# Patient Record
Sex: Female | Born: 1980 | Race: Black or African American | Hispanic: No | Marital: Single | Smoking: Never smoker
Health system: Southern US, Community
[De-identification: ages and names within clinical notes are randomized; demographics above are authoritative.]

## PROBLEM LIST (undated history)

## (undated) ENCOUNTER — Emergency Department (HOSPITAL_BASED_OUTPATIENT_CLINIC_OR_DEPARTMENT_OTHER): Payer: Medicaid Other | Source: Home / Self Care

## (undated) DIAGNOSIS — E611 Iron deficiency: Secondary | ICD-10-CM

## (undated) HISTORY — PX: BUNIONECTOMY: SHX129

---

## 2010-05-10 ENCOUNTER — Ambulatory Visit: Payer: Self-pay | Admitting: Diagnostic Radiology

## 2010-05-10 ENCOUNTER — Emergency Department (HOSPITAL_BASED_OUTPATIENT_CLINIC_OR_DEPARTMENT_OTHER): Admission: EM | Admit: 2010-05-10 | Discharge: 2010-05-10 | Payer: Self-pay | Admitting: Emergency Medicine

## 2010-06-10 ENCOUNTER — Encounter
Admission: RE | Admit: 2010-06-10 | Discharge: 2010-07-11 | Payer: Self-pay | Source: Home / Self Care | Admitting: Orthopedic Surgery

## 2010-06-30 ENCOUNTER — Emergency Department (HOSPITAL_BASED_OUTPATIENT_CLINIC_OR_DEPARTMENT_OTHER): Admission: EM | Admit: 2010-06-30 | Discharge: 2010-06-30 | Payer: Self-pay | Admitting: Emergency Medicine

## 2010-06-30 ENCOUNTER — Ambulatory Visit: Payer: Self-pay | Admitting: Radiology

## 2010-11-08 ENCOUNTER — Emergency Department (HOSPITAL_BASED_OUTPATIENT_CLINIC_OR_DEPARTMENT_OTHER)
Admission: EM | Admit: 2010-11-08 | Discharge: 2010-11-08 | Disposition: A | Discharge status: Home / Self Care | Payer: Medicaid Other | Attending: Emergency Medicine | Admitting: Emergency Medicine

## 2010-11-08 DIAGNOSIS — F410 Panic disorder [episodic paroxysmal anxiety] without agoraphobia: Secondary | ICD-10-CM | POA: Insufficient documentation

## 2010-11-08 DIAGNOSIS — R0602 Shortness of breath: Secondary | ICD-10-CM | POA: Insufficient documentation

## 2010-12-08 LAB — PREGNANCY, URINE: Preg Test, Ur: NEGATIVE

## 2011-03-01 ENCOUNTER — Emergency Department (HOSPITAL_BASED_OUTPATIENT_CLINIC_OR_DEPARTMENT_OTHER)
Admission: EM | Admit: 2011-03-01 | Discharge: 2011-03-01 | Disposition: A | Discharge status: Home / Self Care | Payer: Medicaid Other | Attending: Emergency Medicine | Admitting: Emergency Medicine

## 2011-03-01 DIAGNOSIS — B081 Molluscum contagiosum: Secondary | ICD-10-CM | POA: Insufficient documentation

## 2011-09-02 ENCOUNTER — Emergency Department (HOSPITAL_BASED_OUTPATIENT_CLINIC_OR_DEPARTMENT_OTHER)
Admission: EM | Admit: 2011-09-02 | Discharge: 2011-09-02 | Disposition: A | Discharge status: Home / Self Care | Payer: Self-pay | Attending: Emergency Medicine | Admitting: Emergency Medicine

## 2011-09-02 ENCOUNTER — Encounter: Payer: Self-pay | Admitting: *Deleted

## 2011-09-02 DIAGNOSIS — R3915 Urgency of urination: Secondary | ICD-10-CM | POA: Insufficient documentation

## 2011-09-02 LAB — URINALYSIS, ROUTINE W REFLEX MICROSCOPIC
Bilirubin Urine: NEGATIVE
Glucose, UA: NEGATIVE mg/dL
Hgb urine dipstick: NEGATIVE
Ketones, ur: NEGATIVE mg/dL
Protein, ur: NEGATIVE mg/dL
Specific Gravity, Urine: 1.009 (ref 1.005–1.030)
pH: 7.5 (ref 5.0–8.0)

## 2011-09-02 LAB — URINE MICROSCOPIC-ADD ON

## 2011-09-02 NOTE — ED Provider Notes (Signed)
History  This chart was scribed for Forbes Cellar, MD by Bennett Scrape. This patient was seen in room MHCT3/MHCT3 and the patient's care was started at 5:52PM.  CSN: [id number] Arrival date & time: 09/02/2011  5:00 PM   First MD Initiated Contact with Patient 09/02/11 1740      Chief Complaint  Patient presents with  . Urinary Tract Infection    The history is provided by the patient. No language interpreter was used.   Laura Liu is a 30 y.o. female who presents to the Emergency Department complaining of one day of gradual onset, non-changing increased frequency to urinate that she believes is a UTI. Pt states that she feels the urge to urinate 3 to 4 times during the day.  Pt states that she is experiencing but milder symptoms experienced with a UTI. Pt denies any modifying factors. Pt denies taking anything to improve symptoms. Pt denies dysuria, hematuria, chills, fever, nausea, vomiting, back pain, and neck pain as associated symptoms. Pt denies a h/o diabetes and the possibility of being pregnant. She denies increase thirst. No h/o DM  History reviewed. No pertinent past medical history.  History reviewed. No pertinent past surgical history.  No family history on file.  History  Substance Use Topics  . Smoking status: Never Smoker   . Smokeless tobacco: Not on file  . Alcohol Use: No    OB History    Grav Para Term Preterm Abortions TAB SAB Ect Mult Living                  Review of Systems A complete 10 system review of systems was obtained and is otherwise negative except as noted in the HPI.   Allergies  Review of patient's allergies indicates no known allergies.  Home Medications   Current Outpatient Rx  Name Route Sig Dispense Refill  . MEDROXYPROGESTERONE ACETATE 150 MG/ML IM SUSP Intramuscular Inject 150 mg into the muscle every 3 (three) months.        Triage Vitals: BP 113/76  Pulse 82  Temp(Src) 97.8 F (36.6 C) (Oral)  Resp 20  SpO2  100%  Physical Exam  Nursing note and vitals reviewed. Constitutional: She is oriented to person, place, and time. She appears well-developed and well-nourished.  HENT:  Head: Normocephalic and atraumatic.  Eyes: EOM are normal. Pupils are equal, round, and reactive to light.  Neck: Neck supple. No tracheal deviation present.  Cardiovascular: Normal rate, regular rhythm and normal heart sounds.  Exam reveals no gallop and no friction rub.   No murmur heard. Pulmonary/Chest: Effort normal and breath sounds normal.       Lungs are clear to auscultation bilaterally.  Abdominal: Soft. There is no tenderness. There is no rebound and no guarding.  Musculoskeletal: Normal range of motion. She exhibits no edema.  Neurological: She is alert and oriented to person, place, and time.  Skin: Skin is warm and dry.    ED Course  Procedures (including critical care time)  DIAGNOSTIC STUDIES: Oxygen Saturation is 100% on room air, normal by my interpretation.   COORDINATION OF CARE: 5:53PM-Discussed negative urine tests and pt acknowledged findings. Pt turned down more extensive urine testing. Pt requested that she be discharged from the ED before 6:00PM.   Labs Reviewed  URINALYSIS, ROUTINE W REFLEX MICROSCOPIC - Abnormal; Notable for the following:    Leukocytes, UA TRACE (*)    All other components within normal limits  URINE MICROSCOPIC-ADD ON - Abnormal; Notable for the  following:    Squamous Epithelial / LPF FEW (*)    Bacteria, UA FEW (*)    All other components within normal limits  PREGNANCY, URINE  URINE CULTURE   No results found.   1. Urinary urgency     MDM  Urinary urgency. U/A with few bacteria but also squam cells, no significant amt of WBC. Will send for cx and treat accordingly. No abdominal pain. Declining glu check today. Precautions for return.  I personally performed the services described in this documentation, which was scribed in my presence. The recorded  information has been reviewed and considered.    Forbes Cellar, MD 09/03/11 0001

## 2011-09-02 NOTE — ED Notes (Signed)
Pt d/c home- no Rx given

## 2011-09-02 NOTE — ED Notes (Signed)
Pt presnts to ED today with UTI sx since yesterday.

## 2011-09-03 LAB — URINE CULTURE: Culture  Setup Time: 201212082326

## 2012-02-01 ENCOUNTER — Encounter (HOSPITAL_BASED_OUTPATIENT_CLINIC_OR_DEPARTMENT_OTHER): Payer: Self-pay | Admitting: *Deleted

## 2012-02-01 ENCOUNTER — Emergency Department (INDEPENDENT_AMBULATORY_CARE_PROVIDER_SITE_OTHER): Payer: Medicaid Other

## 2012-02-01 ENCOUNTER — Emergency Department (HOSPITAL_BASED_OUTPATIENT_CLINIC_OR_DEPARTMENT_OTHER)
Admission: EM | Admit: 2012-02-01 | Discharge: 2012-02-01 | Disposition: A | Discharge status: Home / Self Care | Payer: Medicaid Other | Attending: Emergency Medicine | Admitting: Emergency Medicine

## 2012-02-01 DIAGNOSIS — M79609 Pain in unspecified limb: Secondary | ICD-10-CM

## 2012-02-01 DIAGNOSIS — M25539 Pain in unspecified wrist: Secondary | ICD-10-CM

## 2012-02-01 DIAGNOSIS — W19XXXA Unspecified fall, initial encounter: Secondary | ICD-10-CM | POA: Insufficient documentation

## 2012-02-01 DIAGNOSIS — S6990XA Unspecified injury of unspecified wrist, hand and finger(s), initial encounter: Secondary | ICD-10-CM | POA: Insufficient documentation

## 2012-02-01 DIAGNOSIS — M79643 Pain in unspecified hand: Secondary | ICD-10-CM

## 2012-02-01 MED ORDER — IBUPROFEN 400 MG PO TABS: 400.0000 mg | ORAL_TABLET | Freq: Four times a day (QID) | ORAL | Status: AC | PRN

## 2012-02-01 NOTE — Discharge Instructions (Signed)
Wrist Pain Wrist injuries are frequent in adults and children. A sprain is an injury to the ligaments that hold your bones together. A strain is an injury to muscle or muscle cord-like structures (tendons) from stretching or pulling. Generally, when wrists are moderately tender to touch following a fall or injury, a break in the bone (fracture) may be present. Most wrist sprains or strains are better in 3 to 5 days, but complete healing may take several weeks. HOME CARE INSTRUCTIONS   Put ice on the injured area.   Put ice in a plastic bag.   Place a towel between your skin and the bag.   Leave the ice on for 15 to 20 minutes, 3 to 4 times a day, for the first 2 days.   Keep your arm raised above the level of your heart whenever possible to reduce swelling and pain.   Rest the injured area for at least 48 hours or as directed by your caregiver.   If a splint or elastic bandage has been applied, use it for as long as directed by your caregiver or until seen by a caregiver for a follow-up exam.   Only take over-the-counter or prescription medicines for pain, discomfort, or fever as directed by your caregiver.   Keep all follow-up appointments. You may need to follow up with a specialist or have follow-up X-rays. Improvement in pain level is not a guarantee that you did not fracture a bone in your wrist. The only way to determine whether or not you have a broken bone is by X-ray.  SEEK IMMEDIATE MEDICAL CARE IF:   Your fingers are swollen, very red, white, or cold and blue.   Your fingers are numb or tingling.   You have increasing pain.   You have difficulty moving your fingers.  MAKE SURE YOU:   Understand these instructions.   Will watch your condition.   Will get help right away if you are not doing well or get worse.  Document Released: 06/21/2005 Document Revised: 08/31/2011 Document Reviewed: 11/02/2010 [employer] Patient Information 2012 [employer]. 

## 2012-02-01 NOTE — ED Provider Notes (Signed)
Medical screening examination/treatment/procedure(s) were performed by non-physician practitioner and as supervising physician I was immediately available for consultation/collaboration.  Kloey Cazarez T Chaise Passarella, MD 02/01/12 2211 

## 2012-02-01 NOTE — ED Notes (Signed)
Fell 3 weeks ago. Injury to her right hand.

## 2012-02-01 NOTE — ED Notes (Signed)
After assessment the Pt. Now reports the R wrist hurts more than her R hand

## 2012-02-01 NOTE — ED Provider Notes (Signed)
History     CSN: [id number]  Arrival date & time 02/01/12  1908   First MD Initiated Contact with Patient 02/01/12 1923      No chief complaint on file.   (Consider location/radiation/quality/duration/timing/severity/associated sxs/prior treatment) HPI Comments: Pt states that she fell 3 weeks ago and she he has been having pain to right wrist and hand:pt states that she was seen at [facility] and was given something for pain:pt states that she is continuing to have pain:pt states that she has pain with rotation of wrist  Patient is a 31 y.o. female presenting with hand injury. The history is provided by the patient. No language interpreter was used.  Hand Injury  The incident occurred more than 1 week ago. The injury mechanism was a fall. The pain is present in the right wrist and right hand. The quality of the pain is described as aching. The pain is moderate. The pain has been constant since the incident. She reports no foreign bodies present. The symptoms are aggravated by movement. The treatment provided no relief.    History reviewed. No pertinent past medical history.  History reviewed. No pertinent past surgical history.  No family history on file.  History  Substance Use Topics  . Smoking status: Never Smoker   . Smokeless tobacco: Not on file  . Alcohol Use: No    OB History    Grav Para Term Preterm Abortions TAB SAB Ect Mult Living                  Review of Systems  Constitutional: Negative.   Respiratory: Negative.   Cardiovascular: Negative.   Neurological: Negative.     Allergies  Review of patient's allergies indicates no known allergies.  Home Medications   Current Outpatient Rx  Name Route Sig Dispense Refill  . MEDROXYPROGESTERONE ACETATE 150 MG/ML IM SUSP Intramuscular Inject 150 mg into the muscle every 3 (three) months.        BP 122/77  Pulse 83  Temp(Src) 98.6 F (37 C) (Oral)  Resp 16  Ht 5\' 6"  (1.676 m)  Wt 109 lb (49.442 kg)   BMI 17.59 kg/m2  SpO2 100%  Physical Exam  Nursing note and vitals reviewed. Constitutional: She is oriented to person, place, and time. She appears well-developed and well-nourished.  Cardiovascular: Normal rate and regular rhythm.   Pulmonary/Chest: Effort normal and breath sounds normal.  Musculoskeletal: Normal range of motion.       No swelling or deformity noted to the wrist or hand:pt has generalized tenderness noted to the wrist:pulses intact  Neurological: She is alert and oriented to person, place, and time.  Skin: Skin is warm and dry.    ED Course  Procedures (including critical care time)  Labs Reviewed - No data to display Dg Wrist Complete Right  02/01/2012  *RADIOLOGY REPORT*  Clinical Data: Fall 3 weeks ago.  Wrist pain with radial deviation.  RIGHT WRIST - COMPLETE 3+ VIEW  Comparison: None.  Findings: The wrist is located.  No acute bone or soft tissue abnormality.  IMPRESSION: Negative right wrist.  Original Report Authenticated By: Jamesetta Orleans. MATTERN, M.D.   Dg Hand Complete Right  02/01/2012  *RADIOLOGY REPORT*  Clinical Data: Fall.  Hand and wrist pain.  RIGHT HAND - COMPLETE 3+ VIEW  Comparison: None.  Findings: Anatomic alignment of the right hand.  No fracture.  Soft tissues appear within normal limits.  IMPRESSION: Negative radiographs of the right hand.  Original  Report Authenticated By: Andreas Newport, M.D.     1. Wrist pain   2. Hand pain       MDM  Pt is neurologically intact:no bony deformity noted:pt splinted for comfort:pt given ortho follow up        Teressa Lower, NP 02/01/12 2014

## 2013-02-08 ENCOUNTER — Emergency Department (HOSPITAL_BASED_OUTPATIENT_CLINIC_OR_DEPARTMENT_OTHER): Payer: Medicaid Other

## 2013-02-08 ENCOUNTER — Emergency Department (HOSPITAL_BASED_OUTPATIENT_CLINIC_OR_DEPARTMENT_OTHER)
Admission: EM | Admit: 2013-02-08 | Discharge: 2013-02-08 | Disposition: A | Discharge status: Home / Self Care | Payer: Medicaid Other | Attending: Emergency Medicine | Admitting: Emergency Medicine

## 2013-02-08 ENCOUNTER — Encounter (HOSPITAL_BASED_OUTPATIENT_CLINIC_OR_DEPARTMENT_OTHER): Payer: Self-pay | Admitting: *Deleted

## 2013-02-08 DIAGNOSIS — J3489 Other specified disorders of nose and nasal sinuses: Secondary | ICD-10-CM | POA: Insufficient documentation

## 2013-02-08 DIAGNOSIS — H00019 Hordeolum externum unspecified eye, unspecified eyelid: Secondary | ICD-10-CM | POA: Insufficient documentation

## 2013-02-08 DIAGNOSIS — R059 Cough, unspecified: Secondary | ICD-10-CM | POA: Insufficient documentation

## 2013-02-08 DIAGNOSIS — R079 Chest pain, unspecified: Secondary | ICD-10-CM | POA: Insufficient documentation

## 2013-02-08 DIAGNOSIS — R0982 Postnasal drip: Secondary | ICD-10-CM | POA: Insufficient documentation

## 2013-02-08 DIAGNOSIS — R05 Cough: Secondary | ICD-10-CM | POA: Insufficient documentation

## 2013-02-08 DIAGNOSIS — H00013 Hordeolum externum right eye, unspecified eyelid: Secondary | ICD-10-CM

## 2013-02-08 DIAGNOSIS — Z3202 Encounter for pregnancy test, result negative: Secondary | ICD-10-CM | POA: Insufficient documentation

## 2013-02-08 LAB — PREGNANCY, URINE: Preg Test, Ur: NEGATIVE

## 2013-02-08 MED ORDER — LORATADINE-PSEUDOEPHEDRINE ER 5-120 MG PO TB12: 1.0000 | ORAL_TABLET | Freq: Two times a day (BID) | ORAL | Status: DC

## 2013-02-08 MED ORDER — BACITRACIN 500 UNIT/GM EX OINT
1.0000 "application " | TOPICAL_OINTMENT | Freq: Two times a day (BID) | CUTANEOUS | Status: DC
  Administered 2013-02-08: 1 via TOPICAL
  Filled 2013-02-08: qty 0.9

## 2013-02-08 NOTE — ED Provider Notes (Signed)
History    This chart was scribed for Charles B. Bernette Mayers, MD by Quintella Reichert, ED scribe.  This patient was seen in room MH05/MH05 and the patient's care was started at 9:19 PM.   CSN: [id number]  Arrival date & time 02/08/13  2013      Chief Complaint  Patient presents with  . Cough     The history is provided by the patient. No language interpreter was used.    HPI Comments: Laura Liu is a 32 y.o. female who presents to the Emergency Department complaining of cough that she has had "all winter long."  Pt states cough is worst at night and is productive of phlegm that is sometimes clear and sometimes green.  She also reports intermittent CP secondary to cough.  She notes that she attempted to treat cough with Mucinex, which relieved cough only temporarily.  Pt also reports rhinorrhea, post-nasal drip, and congestion.  She denies fever, chills, emesis, diarrhea, appetite change, swelling, CP, SOB, acid reflux, or any other associated symptoms..  She has not attempted to treat symptoms with allergy medications.  Pt does not smoke and is not regularly exposed to second-hand smoke.  She states she is not exposed to hazardous chemicals at work.  PCP is Dr. Ferdinand Cava at [facility]   History reviewed. No pertinent past medical history.  History reviewed. No pertinent past surgical history.  No family history on file.  History  Substance Use Topics  . Smoking status: Never Smoker   . Smokeless tobacco: Not on file  . Alcohol Use: No    OB History   Grav Para Term Preterm Abortions TAB SAB Ect Mult Living                  Review of Systems A complete 10 system review of systems was obtained and all systems are negative except as noted in the HPI and PMH.    Allergies  Review of patient's allergies indicates no known allergies.  Home Medications   Current Outpatient Rx  Name  Route  Sig  Dispense  Refill  . medroxyPROGESTERone (DEPO-PROVERA) 150 MG/ML injection    Intramuscular   Inject 150 mg into the muscle every 3 (three) months.             BP 118/87  Pulse 78  Temp(Src) 98.9 F (37.2 C) (Oral)  Resp 20  Ht 5\' 6"  (1.676 m)  Wt 109 lb (49.442 kg)  BMI 17.6 kg/m2  SpO2 96%  Physical Exam  Nursing note and vitals reviewed. Constitutional: She is oriented to person, place, and time. She appears well-developed and well-nourished.  HENT:  Head: Normocephalic and atraumatic.  Eyes: EOM are normal. Pupils are equal, round, and reactive to light.  Sty in left eyelid  Neck: Normal range of motion. Neck supple.  Cardiovascular: Normal rate, regular rhythm, normal heart sounds and intact distal pulses.   No murmur heard. Pulmonary/Chest: Effort normal and breath sounds normal. No respiratory distress. She has no wheezes. She has no rales.  Abdominal: Bowel sounds are normal. She exhibits no distension. There is no tenderness.  Musculoskeletal: Normal range of motion. She exhibits no edema and no tenderness.  Neurological: She is alert and oriented to person, place, and time. She has normal strength. No cranial nerve deficit or sensory deficit.  Skin: Skin is warm and dry. No rash noted.  Psychiatric: She has a normal mood and affect.    ED Course  Procedures (including critical care  time)  DIAGNOSTIC STUDIES: Oxygen Saturation is 96% on room air, normal by my interpretation.    COORDINATION OF CARE: 9:23 PM-Discussed treatment plan which includes imaging with pt at bedside and pt agreed to plan.      Labs Reviewed  PREGNANCY, URINE   Dg Chest 2 View  02/08/2013   *RADIOLOGY REPORT*  Clinical Data: Cough for 6 months  CHEST - 2 VIEW  Comparison: 06/30/2010  Findings: The heart size and mediastinal contours are within normal limits. The lungs are hyperinflated and clear.  The visualized skeletal structures are unremarkable.  IMPRESSION: 1.  Hyperinflation.   Original Report Authenticated By: Signa Kell, M.D.     1. Cough   2.  Post-nasal drip   3. Hordeolum, right       MDM  CXR is negative. No wheezing or hypoxia. Suspect cough is due to post-nasal drip. Advised decongestant/antihistamine. PCP follow up if symptoms persistent. Pt and mother asking about inhaler, but doubt that this would be helpful given normal exam. Bacitracin and warm compresses for hordeolum.      I personally performed the services described in this documentation, which was scribed in my presence. The recorded information has been reviewed and is accurate.     Charles B. Bernette Mayers, MD 02/08/13 [phone]

## 2013-02-08 NOTE — ED Notes (Signed)
Pt states she has had a cough"all winter long and now it has come back". Prod with clr sputum at times and green at others. BBS-clr

## 2013-08-08 ENCOUNTER — Encounter (HOSPITAL_BASED_OUTPATIENT_CLINIC_OR_DEPARTMENT_OTHER): Payer: Self-pay | Admitting: Emergency Medicine

## 2013-08-08 ENCOUNTER — Emergency Department (HOSPITAL_BASED_OUTPATIENT_CLINIC_OR_DEPARTMENT_OTHER)
Admission: EM | Admit: 2013-08-08 | Discharge: 2013-08-08 | Disposition: A | Discharge status: Home / Self Care | Payer: Medicaid Other | Attending: Emergency Medicine | Admitting: Emergency Medicine

## 2013-08-08 DIAGNOSIS — Z79899 Other long term (current) drug therapy: Secondary | ICD-10-CM | POA: Insufficient documentation

## 2013-08-08 DIAGNOSIS — J069 Acute upper respiratory infection, unspecified: Secondary | ICD-10-CM | POA: Insufficient documentation

## 2013-08-08 DIAGNOSIS — R110 Nausea: Secondary | ICD-10-CM | POA: Insufficient documentation

## 2013-08-08 DIAGNOSIS — R42 Dizziness and giddiness: Secondary | ICD-10-CM | POA: Insufficient documentation

## 2013-08-08 DIAGNOSIS — Z3202 Encounter for pregnancy test, result negative: Secondary | ICD-10-CM | POA: Insufficient documentation

## 2013-08-08 LAB — URINALYSIS, ROUTINE W REFLEX MICROSCOPIC
Bilirubin Urine: NEGATIVE
Hgb urine dipstick: NEGATIVE
Ketones, ur: NEGATIVE mg/dL
Nitrite: NEGATIVE
Protein, ur: NEGATIVE mg/dL
Specific Gravity, Urine: 1.002 — ABNORMAL LOW (ref 1.005–1.030)
Urobilinogen, UA: 0.2 mg/dL (ref 0.0–1.0)
pH: 7 (ref 5.0–8.0)

## 2013-08-08 LAB — URINE MICROSCOPIC-ADD ON

## 2013-08-08 LAB — PREGNANCY, URINE: Preg Test, Ur: NEGATIVE

## 2013-08-08 MED ORDER — MECLIZINE HCL 25 MG PO TABS
12.5000 mg | ORAL_TABLET | Freq: Once | ORAL | Status: AC
  Administered 2013-08-08: 12.5 mg via ORAL
  Filled 2013-08-08: qty 1

## 2013-08-08 MED ORDER — MECLIZINE HCL 50 MG PO TABS: 50.0000 mg | ORAL_TABLET | Freq: Three times a day (TID) | ORAL | Status: DC | PRN

## 2013-08-08 NOTE — ED Notes (Signed)
MD at bedside. 

## 2013-08-08 NOTE — ED Provider Notes (Signed)
CSN: [id number]     Arrival date & time 08/08/13  2213 History  This chart was scribed for Hilario Quarry, MD by Joaquin Music, ED Scribe. This patient was seen in room MH10/MH10 and the patient's care was started at 10:29 PM.    Chief Complaint  Patient presents with  . Dizziness   The history is provided by the patient. No language interpreter was used.   HPI Comments: Reola Buckles is a 32 y.o. female who presents to the Emergency Department complaining of ongoing, worsening dizziness that began 24 hours ago. Pt states she feels the "room is spinning". Pt states she feels nauseas. She states she is able to ambulate but states symtoms worsens when she bends her head down or walks. She states she has had a normal appetite and has been drinking tea frequently. Pt denies having otalgia and nasal congestion.  Pt denies having health complications. Pt denies taking rx everyday. Pt states she is currently on Depo Shots. She is scheduled to F/U 08/12/2013. Pt denies smoking and drinking.   History reviewed. No pertinent past medical history. History reviewed. No pertinent past surgical history. History reviewed. No pertinent family history. History  Substance Use Topics  . Smoking status: Never Smoker   . Smokeless tobacco: Not on file  . Alcohol Use: No   OB History   Grav Para Term Preterm Abortions TAB SAB Ect Mult Living                 Review of Systems  Gastrointestinal: Positive for nausea.  Neurological: Positive for dizziness.  All other systems reviewed and are negative.    Allergies  Review of patient's allergies indicates no known allergies.  Home Medications   Current Outpatient Rx  Name  Route  Sig  Dispense  Refill  . loratadine-pseudoephedrine (CLARITIN-D 12 HOUR) 5-120 MG per tablet   Oral   Take 1 tablet by mouth 2 (two) times daily.   30 tablet   0   . medroxyPROGESTERone (DEPO-PROVERA) 150 MG/ML injection   Intramuscular   Inject 150 mg  into the muscle every 3 (three) months.            BP 150/89  Pulse 86  Temp(Src) 98.3 F (36.8 C) (Oral)  Resp 16  Ht 5\' 5"  (1.651 m)  Wt 95 lb (43.092 kg)  BMI 15.81 kg/m2  SpO2 100%  Physical Exam  Nursing note and vitals reviewed. Constitutional: She is oriented to person, place, and time. She appears well-developed and well-nourished.  HENT:  Head: Normocephalic and atraumatic.  Right Ear: External ear normal.  Left Ear: External ear normal.  Nose: Nose normal.  Mouth/Throat: Oropharynx is clear and moist.  Eyes: Conjunctivae and EOM are normal. Pupils are equal, round, and reactive to light.  No nystagmus noted.  Neck: Normal range of motion. Neck supple.  Cardiovascular: Normal rate, regular rhythm, normal heart sounds and intact distal pulses.   Pulmonary/Chest: Effort normal and breath sounds normal.  Abdominal: Soft. Bowel sounds are normal.  Musculoskeletal: Normal range of motion.  Neurological: She is alert and oriented to person, place, and time. She has normal reflexes.  Skin: Skin is warm and dry.  Psychiatric: She has a normal mood and affect. Her behavior is normal. Thought content normal.    ED Course  Procedures  DIAGNOSTIC STUDIES: Oxygen Saturation is 100% on RA, normal by my interpretation.    COORDINATION OF CARE: 10:31 PM-Discussed treatment plan which includes UA, pregnancy test  and other labs. Pt agreed to plan.   Labs Review Labs Reviewed  URINALYSIS, ROUTINE W REFLEX MICROSCOPIC - Abnormal; Notable for the following:    APPearance CLOUDY (*)    Specific Gravity, Urine 1.002 (*)    Leukocytes, UA SMALL (*)    All other components within normal limits  URINE MICROSCOPIC-ADD ON - Abnormal; Notable for the following:    Squamous Epithelial / LPF MANY (*)    Bacteria, UA FEW (*)    All other components within normal limits  URINE CULTURE  PREGNANCY, URINE   Imaging Review No results found.  EKG Interpretation   None       MDM    1. URI (upper respiratory infection)   2. Vertigo   I personally performed the services described in this documentation, which was scribed in my presence. The recorded information has been reviewed and considered.    Hilario Quarry, MD 08/12/13 1400

## 2013-08-08 NOTE — ED Notes (Signed)
Pt c/o dizziness x 2 days denies any other complaints

## 2013-08-10 LAB — URINE CULTURE

## 2014-04-11 ENCOUNTER — Emergency Department (HOSPITAL_BASED_OUTPATIENT_CLINIC_OR_DEPARTMENT_OTHER): Payer: Medicaid Other

## 2014-04-11 ENCOUNTER — Emergency Department (HOSPITAL_BASED_OUTPATIENT_CLINIC_OR_DEPARTMENT_OTHER)
Admission: EM | Admit: 2014-04-11 | Discharge: 2014-04-11 | Disposition: A | Discharge status: Home / Self Care | Payer: Medicaid Other | Attending: Emergency Medicine | Admitting: Emergency Medicine

## 2014-04-11 ENCOUNTER — Encounter (HOSPITAL_BASED_OUTPATIENT_CLINIC_OR_DEPARTMENT_OTHER): Payer: Self-pay | Admitting: Emergency Medicine

## 2014-04-11 DIAGNOSIS — T39095A Adverse effect of salicylates, initial encounter: Secondary | ICD-10-CM | POA: Insufficient documentation

## 2014-04-11 DIAGNOSIS — Z862 Personal history of diseases of the blood and blood-forming organs and certain disorders involving the immune mechanism: Secondary | ICD-10-CM | POA: Diagnosis not present

## 2014-04-11 DIAGNOSIS — T50905A Adverse effect of unspecified drugs, medicaments and biological substances, initial encounter: Secondary | ICD-10-CM

## 2014-04-11 DIAGNOSIS — Z79899 Other long term (current) drug therapy: Secondary | ICD-10-CM | POA: Insufficient documentation

## 2014-04-11 DIAGNOSIS — R0602 Shortness of breath: Secondary | ICD-10-CM | POA: Diagnosis present

## 2014-04-11 HISTORY — DX: Iron deficiency: E61.1

## 2014-04-11 MED ORDER — IBUPROFEN 800 MG PO TABS: 800.0000 mg | ORAL_TABLET | Freq: Three times a day (TID) | ORAL | Status: DC

## 2014-04-11 NOTE — ED Notes (Signed)
Shortness of breath and headache since yesterday, pt denies fever or cough. Pt states "I think my iron is low"

## 2014-04-11 NOTE — ED Provider Notes (Signed)
CSN: [id number]     Arrival date & time 04/11/14  0018 History   First MD Initiated Contact with Patient 04/11/14 0032     Chief Complaint  Patient presents with  . Shortness of Breath     (Consider location/radiation/quality/duration/timing/severity/associated sxs/prior Treatment) Patient is a 33 y.o. female presenting with shortness of breath. The history is provided by the patient.  Shortness of Breath Severity:  Moderate Onset quality:  Gradual Timing:  Constant Progression:  Unchanged Chronicity:  New Context: not activity   Context comment:  Takling a BC powder Relieved by:  Nothing Worsened by:  Nothing tried Ineffective treatments:  None tried Associated symptoms: no abdominal pain, no chest pain, no claudication, no cough, no diaphoresis, no fever, no hemoptysis, no neck pain, no PND, no rash, no sore throat, no sputum production, no syncope, no swollen glands, no vomiting and no wheezing   Risk factors: no recent alcohol use     Past Medical History  Diagnosis Date  . Low iron    History reviewed. No pertinent past surgical history. History reviewed. No pertinent family history. History  Substance Use Topics  . Smoking status: Never Smoker   . Smokeless tobacco: Not on file  . Alcohol Use: No   OB History   Grav Para Term Preterm Abortions TAB SAB Ect Mult Living                 Review of Systems  Constitutional: Negative for fever and diaphoresis.  HENT: Negative for sore throat.   Respiratory: Positive for shortness of breath. Negative for cough, hemoptysis, sputum production, choking, chest tightness and wheezing.   Cardiovascular: Negative for chest pain, palpitations, claudication, leg swelling, syncope and PND.  Gastrointestinal: Negative for vomiting and abdominal pain.  Musculoskeletal: Negative for neck pain.  Skin: Negative for rash.  All other systems reviewed and are negative.     Allergies  Review of patient's allergies indicates no  known allergies.  Home Medications   Prior to Admission medications   Medication Sig Start Date End Date Taking? Authorizing Provider  medroxyPROGESTERone (DEPO-PROVERA) 150 MG/ML injection Inject 150 mg into the muscle every 3 (three) months.     Yes Historical Provider, MD  loratadine-pseudoephedrine (CLARITIN-D 12 HOUR) 5-120 MG per tablet Take 1 tablet by mouth 2 (two) times daily. 02/08/13   Charles B. Bernette MayersSheldon, MD  meclizine (ANTIVERT) 50 MG tablet Take 1 tablet (50 mg total) by mouth 3 (three) times daily as needed. 08/08/13   Hilario Quarryanielle S Ray, MD   BP 175/91  Pulse 68  Temp(Src) 97.9 F (36.6 C) (Oral)  Resp 18  Ht 5\' 5"  (1.651 m)  Wt 98 lb (44.453 kg)  BMI 16.31 kg/m2  SpO2 100% Physical Exam  Constitutional: She is oriented to person, place, and time. She appears well-developed and well-nourished. No distress.  HENT:  Head: Normocephalic and atraumatic.  Mouth/Throat: Oropharynx is clear and moist.  No swelling of the lips tongue or uvula  Eyes: Conjunctivae and EOM are normal. Pupils are equal, round, and reactive to light.  Neck: Normal range of motion. Neck supple. No thyromegaly present.  Cardiovascular: Normal rate, regular rhythm and intact distal pulses.   Pulmonary/Chest: Effort normal and breath sounds normal. No stridor. No respiratory distress. She has no wheezes. She has no rales.  Abdominal: Soft. Bowel sounds are normal. There is no tenderness. There is no rebound and no guarding.  Musculoskeletal: Normal range of motion. She exhibits no edema and no tenderness.  Lymphadenopathy:    She has no cervical adenopathy.  Neurological: She is alert and oriented to person, place, and time. She has normal reflexes.  Skin: Skin is warm and dry. She is not diaphoretic. No pallor.  Psychiatric: She has a normal mood and affect.    ED Course  Procedures (including critical care time) Labs Review Labs Reviewed - No data to display  Imaging Review Dg Chest 2  View  04/11/2014   CLINICAL DATA:  Shortness of breath, headache  EXAM: CHEST  2 VIEW  COMPARISON:  02/08/2013  FINDINGS: The heart size and mediastinal contours are within normal limits. Both lungs are clear. The visualized skeletal structures are unremarkable.  IMPRESSION: No active cardiopulmonary disease.   Electronically Signed   By: Elige Ko   On: 04/11/2014 01:09     EKG Interpretation None      MDM   Final diagnoses:  Medication reaction, initial encounter    PERC negative wells 0, doubt PE.  Symptoms started post [facility] powder suspect caffeine for HA did not agree with her.  Lots of water.  NO BC powders    Jaivon Vanbeek K Audelia Knape-Rasch, MD 04/11/14 [phone]

## 2014-07-28 ENCOUNTER — Encounter: Payer: Self-pay | Admitting: Podiatry

## 2014-07-28 ENCOUNTER — Ambulatory Visit (INDEPENDENT_AMBULATORY_CARE_PROVIDER_SITE_OTHER): Payer: Medicaid Other | Admitting: Podiatry

## 2014-07-28 VITALS — BP 108/78 | HR 85 | Ht 65.0 in | Wt 101.0 lb

## 2014-07-28 DIAGNOSIS — M21619 Bunion of unspecified foot: Secondary | ICD-10-CM | POA: Insufficient documentation

## 2014-07-28 DIAGNOSIS — M216X9 Other acquired deformities of unspecified foot: Secondary | ICD-10-CM

## 2014-07-28 DIAGNOSIS — M2012 Hallux valgus (acquired), left foot: Secondary | ICD-10-CM

## 2014-07-28 DIAGNOSIS — M21969 Unspecified acquired deformity of unspecified lower leg: Secondary | ICD-10-CM

## 2014-07-28 DIAGNOSIS — M21612 Bunion of left foot: Secondary | ICD-10-CM

## 2014-07-28 NOTE — Progress Notes (Signed)
Subjective: Has had bunionectomy on right foot in April 2015. Since then right great toe joint is painful.  Stated that her feet get tired easily and cannot stand on feet longer than one hour before she must sit.  Review of Systems - General ROS: negative for - chills, fatigue, fever, night sweats or sleep disturbance Ophthalmic ROS: negative ENT ROS: negative Allergy and Immunology ROS: negative Breast ROS: negative for breast lumps Respiratory ROS: no cough, shortness of breath, or wheezing Cardiovascular ROS: no chest pain or dyspnea on exertion Gastrointestinal ROS: no abdominal pain, change in bowel habits, or black or bloody stools Genito-Urinary ROS: no dysuria, trouble voiding, or hematuria Musculoskeletal ROS: Frequent knee joint pain and occasional back pain.   Neurological ROS: no TIA or stroke symptoms Dermatological ROS: negative.  Objective: Dermatologic: Normal skin. No open lesions. Vascular: All pedal pulses are palpable. Orthopedic: Dorsiflexed first MPJ right following bunionectomy, April 2015. 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 Flexible flat foot bilateral.  Neurologic: All epicritic and tactile sensations grossly intact.  Radiographic examination reveal severely elevated first ray bilateral L>R. Previous surgical procedure with pin fixation at the first Metatarsal head right foot.   Assessment: Stiffness of the first MPJ following surgery. Hypermobile first ray bilateral L>R. STJ hyperpronation bilateral.  Plan: Reviewed the findings. Explained the need for orthotics for her abnormal foot structure. Advised to do stretch exercise.

## 2014-07-28 NOTE — Patient Instructions (Signed)
Seen for stiffness on right foot following bunionectomy. Consider it a normal and common findings after foot surgery. May benefit from massaging the area.

## 2014-08-07 ENCOUNTER — Ambulatory Visit (INDEPENDENT_AMBULATORY_CARE_PROVIDER_SITE_OTHER): Payer: Medicaid Other | Admitting: Podiatry

## 2014-08-07 ENCOUNTER — Encounter: Payer: Self-pay | Admitting: Podiatry

## 2014-08-07 DIAGNOSIS — M21969 Unspecified acquired deformity of unspecified lower leg: Secondary | ICD-10-CM

## 2014-08-07 DIAGNOSIS — M79673 Pain in unspecified foot: Secondary | ICD-10-CM

## 2014-08-07 DIAGNOSIS — M216X9 Other acquired deformities of unspecified foot: Secondary | ICD-10-CM

## 2014-08-07 NOTE — Patient Instructions (Signed)
Seen for disability evaluation. Paper filled out.  Return as needed.

## 2014-08-07 NOTE — Progress Notes (Signed)
Patient presents for disability evaluation. Works on weekends cleaning buildings.  Stated that she is not able to stand more than one hour before she must sit and rest due to her back, knee, and foot pain.  This problems has been going on for about a year.  Paper filled out.  Return as needed.

## 2014-12-09 ENCOUNTER — Telehealth: Payer: Self-pay | Admitting: *Deleted

## 2014-12-09 NOTE — Telephone Encounter (Signed)
Dr. Raynald KempSheard, Patient called this am and ask could you fill out a paper for her to get a Handicap Sticker.

## 2014-12-21 ENCOUNTER — Ambulatory Visit (INDEPENDENT_AMBULATORY_CARE_PROVIDER_SITE_OTHER): Payer: Medicaid Other | Admitting: Podiatry

## 2014-12-21 ENCOUNTER — Encounter: Payer: Self-pay | Admitting: Podiatry

## 2014-12-21 VITALS — BP 110/70 | HR 76

## 2014-12-21 DIAGNOSIS — M79671 Pain in right foot: Secondary | ICD-10-CM | POA: Diagnosis not present

## 2014-12-21 DIAGNOSIS — M216X9 Other acquired deformities of unspecified foot: Secondary | ICD-10-CM | POA: Diagnosis not present

## 2014-12-21 DIAGNOSIS — M21961 Unspecified acquired deformity of right lower leg: Secondary | ICD-10-CM

## 2014-12-21 NOTE — Patient Instructions (Signed)
Seen for painful feet R>L. Need to wear laced up tennis shoes with orthotics. Handicap sticker approved and signed.

## 2014-12-21 NOTE — Progress Notes (Signed)
Subjective: Patient came in stating that she has foot pain after been walking for a while on right foot over the old bunion surgery site. Right hurt more than the left. Does soaking to sooth pain. Wants to have handicap stickers. Wants to get OTC orthotics once she can afford to purchase.  Denies any new change since her last visit to this office.  Patient is wearing bedroom slippers.   Objective: Dermatologic: Normal skin. No open lesions. Vascular: All pedal pulses are palpable. Orthopedic: Dorsiflexed first MPJ right following bunionectomy, April 2015. 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 Flexible flat foot bilateral.  Neurologic: All epicritic and tactile sensations grossly intact.  Radiographic examination reveal severely elevated first ray bilateral L>R. Previous surgical procedure with pin fixation at the first Metatarsal head right foot.   Assessment: Stiffness of the first MPJ following surgery. Hypermobile first ray bilateral L>R. STJ hyperpronation bilateral.  Plan: Need to wear lace up tennis shoe with orthotics.

## 2015-06-01 ENCOUNTER — Ambulatory Visit (INDEPENDENT_AMBULATORY_CARE_PROVIDER_SITE_OTHER): Payer: Medicaid Other | Admitting: Podiatry

## 2015-06-01 ENCOUNTER — Encounter: Payer: Self-pay | Admitting: Podiatry

## 2015-06-01 VITALS — BP 115/77 | HR 80

## 2015-06-01 DIAGNOSIS — M216X9 Other acquired deformities of unspecified foot: Secondary | ICD-10-CM | POA: Diagnosis not present

## 2015-06-01 DIAGNOSIS — M21969 Unspecified acquired deformity of unspecified lower leg: Secondary | ICD-10-CM

## 2015-06-01 DIAGNOSIS — M79671 Pain in right foot: Secondary | ICD-10-CM

## 2015-06-01 MED ORDER — MELOXICAM 7.5 MG/5ML PO SUSP: 5.0000 mL | ORAL | Status: DC

## 2015-06-01 NOTE — Patient Instructions (Signed)
Seen for pain in right foot. Meloxicam syrup ordered. Calluses trimmed.  Need OTC orthotics. Return as needed.

## 2015-06-01 NOTE — Progress Notes (Signed)
Subjective: Pain over the first MPJ right plantar and medial border of the first ray x 2 weeks. On feet at work 6-7 walking long walkway.  Patient wants to have pain medication meantime.  Patient is lace up tennis shoes. Still not having Orthotics.   Objective: Dermatologic: Positive of plantar callus under 5th MPJ bilateral.  Vascular: All pedal pulses are palpable. Orthopedic: Dorsiflexed first MPJ right following bunionectomy, April 2015. 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 Flexible flat foot bilateral.  Neurologic: All epicritic and tactile sensations grossly intact.   Assessment: Stiffness of the first MPJ following surgery. Hypermobile first ray bilateral L>R. STJ hyperpronation bilateral. Metatarsalgia first ray right. Plantar callus sub 5 right.   Plan: Need to wear lace up tennis shoe with orthotics

## 2015-06-17 ENCOUNTER — Telehealth: Payer: Self-pay | Admitting: *Deleted

## 2015-06-17 NOTE — Telephone Encounter (Signed)
06/17/15 Dr. Raynald Kemp, Patient called today and ask could she have medicine because her RX we gave her the Insurance wouldn't cover it, for some reason I didn't make a note on our last conversation, I told her to check  her voice messages , because I was sure I called her and left a message, I believe it was Childrens Advil, I asked her to call back tomorrow morning and ask you.

## 2015-07-01 ENCOUNTER — Telehealth: Payer: Self-pay | Admitting: *Deleted

## 2015-07-01 NOTE — Telephone Encounter (Signed)
07/01/15 Dr. Raynald Kemp, Pt has called again today, she could not get the last rx because her Insurance didn't cover it, What did you tell me to tell her to get, was it Childrens Advil? I told her I would call her back this am.

## 2015-08-04 ENCOUNTER — Ambulatory Visit: Payer: Medicaid Other | Admitting: Podiatry

## 2015-08-10 ENCOUNTER — Ambulatory Visit (INDEPENDENT_AMBULATORY_CARE_PROVIDER_SITE_OTHER): Payer: Medicaid Other | Admitting: Podiatry

## 2015-08-10 ENCOUNTER — Encounter: Payer: Self-pay | Admitting: Podiatry

## 2015-08-10 VITALS — BP 117/77 | HR 78

## 2015-08-10 DIAGNOSIS — M21961 Unspecified acquired deformity of right lower leg: Secondary | ICD-10-CM

## 2015-08-10 DIAGNOSIS — M79671 Pain in right foot: Secondary | ICD-10-CM | POA: Diagnosis not present

## 2015-08-10 DIAGNOSIS — M216X9 Other acquired deformities of unspecified foot: Secondary | ICD-10-CM | POA: Diagnosis not present

## 2015-08-10 NOTE — Progress Notes (Signed)
Subjective: 34 year old female presents complaining of pain under the ball of 5th MPJ area right foot.  Past history reveals she has had bunionectomy on right foot in April 2015 at another clinic.  Stated that since then right great toe joint is painful and her feet get tired easily and cannot stand on feet longer than one hour before she must sit.  Objective: Dermatologic: Normal skin. No open lesions. Vascular: All pedal pulses are palpable. Orthopedic: Dorsiflexed first MPJ right following bunionectomy, April 2015. 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 Flexible flat foot bilateral.  Neurologic: All epicritic and tactile sensations grossly intact.   Reviewed previous radiographic findings that reveal severely elevated first ray bilateral L>R. Previous surgical procedure with pin fixation at the first Metatarsal head right foot.   Assessment: Hypermobile first ray bilateral. STJ hyperpronation bilateral. Excess lateral weight shifting. Plantar callus sub 5th MPJ with pain right foot.   Plan: Reviewed the findings and need for orthotics for her abnormal foot structure. Patient will return for orthotics.

## 2015-08-10 NOTE — Patient Instructions (Signed)
Seen for painful callus under 5th MPJ right. Findings reviewed. Need orthotics. Return as needed.

## 2015-09-20 ENCOUNTER — Ambulatory Visit: Payer: Medicaid Other

## 2015-09-20 ENCOUNTER — Ambulatory Visit (INDEPENDENT_AMBULATORY_CARE_PROVIDER_SITE_OTHER): Payer: Medicaid Other | Admitting: Podiatry

## 2015-09-20 ENCOUNTER — Ambulatory Visit (INDEPENDENT_AMBULATORY_CARE_PROVIDER_SITE_OTHER): Payer: Medicaid Other

## 2015-09-20 ENCOUNTER — Encounter: Payer: Self-pay | Admitting: Podiatry

## 2015-09-20 VITALS — BP 122/85 | HR 78 | Resp 16

## 2015-09-20 DIAGNOSIS — M21619 Bunion of unspecified foot: Secondary | ICD-10-CM

## 2015-09-20 DIAGNOSIS — M216X1 Other acquired deformities of right foot: Secondary | ICD-10-CM

## 2015-09-20 DIAGNOSIS — M7750 Other enthesopathy of unspecified foot: Secondary | ICD-10-CM | POA: Diagnosis not present

## 2015-09-20 DIAGNOSIS — M79672 Pain in left foot: Secondary | ICD-10-CM

## 2015-09-20 DIAGNOSIS — M2010 Hallux valgus (acquired), unspecified foot: Secondary | ICD-10-CM | POA: Diagnosis not present

## 2015-09-20 DIAGNOSIS — M779 Enthesopathy, unspecified: Secondary | ICD-10-CM

## 2015-09-20 NOTE — Progress Notes (Signed)
Subjective:     Patient ID: Laura Liu, female   DOB: [birth year], 34 y.o.   MRN: [id number]  HPI patient presents stating that I had surgery performed a proximally 8 months ago on my bunion and that seems okay but I have pain underneath the metatarsal and that can be bothersome deformity and it was there also prior to surgery   Review of Systems  All other systems reviewed and are negative.      Objective:   Physical Exam  Constitutional: She is oriented to person, place, and time.  Cardiovascular: Intact distal pulses.   Musculoskeletal: Normal range of motion.  Neurological: She is oriented to person, place, and time.  Skin: Skin is warm.  Nursing note and vitals reviewed.  neurovascular status intact muscle strength adequate range of motion within normal limits with patient noted to have well-healed surgical site first metatarsal right with discomfort underneath the first metatarsal head right over left with keratotic lesion noted bilateral. Good range of motion is noted and elongated hallux is noted bilateral with patient having moderate plantar flexion of the first metatarsal bilateral     Assessment:     Structural changes with plantarflexed first metatarsal leading to lesion formation underneath the metatarsal head itself    Plan:     Reviewed x-rays and condition with patient. Would like to avoid surgery if possible and today I went ahead and I scanned for custom orthotics to try to reduce plantar pressure. Ultimately may require some form of elevating osteotomy or Lapidus procedure

## 2015-09-20 NOTE — Progress Notes (Signed)
   Subjective:    Patient ID: Laura Liu, female    DOB: [birth year], 34 y.o.   MRN: [id number]  HPI PT presents with pain in her right foot that she attributes to foot surgery, she had a h/o bunion correction done in April 2016. The pain is located in her MPJ on her right foot and is dull and achey and constant, worsens with walking   Review of Systems  Musculoskeletal: Positive for back pain.  All other systems reviewed and are negative.      Objective:   Physical Exam        Assessment & Plan:

## 2015-10-06 ENCOUNTER — Encounter (HOSPITAL_BASED_OUTPATIENT_CLINIC_OR_DEPARTMENT_OTHER): Payer: Self-pay | Admitting: Emergency Medicine

## 2015-10-06 ENCOUNTER — Emergency Department (HOSPITAL_BASED_OUTPATIENT_CLINIC_OR_DEPARTMENT_OTHER)
Admission: EM | Admit: 2015-10-06 | Discharge: 2015-10-06 | Disposition: A | Discharge status: Home / Self Care | Payer: Medicaid Other | Attending: Physician Assistant | Admitting: Physician Assistant

## 2015-10-06 DIAGNOSIS — R531 Weakness: Secondary | ICD-10-CM | POA: Insufficient documentation

## 2015-10-06 DIAGNOSIS — R110 Nausea: Secondary | ICD-10-CM | POA: Diagnosis present

## 2015-10-06 DIAGNOSIS — Z3202 Encounter for pregnancy test, result negative: Secondary | ICD-10-CM | POA: Insufficient documentation

## 2015-10-06 DIAGNOSIS — Z862 Personal history of diseases of the blood and blood-forming organs and certain disorders involving the immune mechanism: Secondary | ICD-10-CM | POA: Insufficient documentation

## 2015-10-06 LAB — CBC WITH DIFFERENTIAL/PLATELET
BASOS PCT: 0 %
Basophils Absolute: 0 10*3/uL (ref 0.0–0.1)
Eosinophils Absolute: 0.2 10*3/uL (ref 0.0–0.7)
Eosinophils Relative: 2 %
HEMATOCRIT: 42.4 % (ref 36.0–46.0)
HEMOGLOBIN: 14.3 g/dL (ref 12.0–15.0)
LYMPHS ABS: 4.6 10*3/uL — AB (ref 0.7–4.0)
LYMPHS PCT: 63 %
MCH: 30.8 pg (ref 26.0–34.0)
MCHC: 33.7 g/dL (ref 30.0–36.0)
MCV: 91.4 fL (ref 78.0–100.0)
MONO ABS: 0.4 10*3/uL (ref 0.1–1.0)
MONOS PCT: 5 %
NEUTROS ABS: 2.2 10*3/uL (ref 1.7–7.7)
NEUTROS PCT: 30 %
Platelets: 215 10*3/uL (ref 150–400)
RBC: 4.64 MIL/uL (ref 3.87–5.11)
RDW: 12.5 % (ref 11.5–15.5)
WBC: 7.4 10*3/uL (ref 4.0–10.5)

## 2015-10-06 LAB — COMPREHENSIVE METABOLIC PANEL
ALBUMIN: 4.6 g/dL (ref 3.5–5.0)
ALK PHOS: 79 U/L (ref 38–126)
ALT: 11 U/L — ABNORMAL LOW (ref 14–54)
ANION GAP: 9 (ref 5–15)
AST: 23 U/L (ref 15–41)
BILIRUBIN TOTAL: 0.5 mg/dL (ref 0.3–1.2)
BUN: 6 mg/dL (ref 6–20)
CALCIUM: 9.1 mg/dL (ref 8.9–10.3)
CO2: 21 mmol/L — ABNORMAL LOW (ref 22–32)
Chloride: 111 mmol/L (ref 101–111)
Creatinine, Ser: 0.68 mg/dL (ref 0.44–1.00)
GFR calc Af Amer: >60 mL/min (ref >60)
GLUCOSE: 107 mg/dL — AB (ref 65–99)
Potassium: 3.3 mmol/L — ABNORMAL LOW (ref 3.5–5.1)
Sodium: 141 mmol/L (ref 135–145)
TOTAL PROTEIN: 8 g/dL (ref 6.5–8.1)

## 2015-10-06 LAB — URINALYSIS, ROUTINE W REFLEX MICROSCOPIC
Bilirubin Urine: NEGATIVE
GLUCOSE, UA: NEGATIVE mg/dL
Hgb urine dipstick: NEGATIVE
KETONES UR: NEGATIVE mg/dL
LEUKOCYTES UA: NEGATIVE
NITRITE: NEGATIVE
PH: 6.5 (ref 5.0–8.0)
Protein, ur: NEGATIVE mg/dL
SPECIFIC GRAVITY, URINE: 1.004 — AB (ref 1.005–1.030)

## 2015-10-06 LAB — PREGNANCY, URINE: Preg Test, Ur: NEGATIVE

## 2015-10-06 MED ORDER — ONDANSETRON 4 MG PO TBDP
ORAL_TABLET | ORAL | Status: AC
  Administered 2015-10-06: 4 mg via ORAL
  Filled 2015-10-06: qty 1

## 2015-10-06 MED ORDER — ACETAMINOPHEN 325 MG PO TABS
650.0000 mg | ORAL_TABLET | Freq: Once | ORAL | Status: AC
  Administered 2015-10-06: 650 mg via ORAL

## 2015-10-06 MED ORDER — ONDANSETRON HCL 4 MG PO TABS: 4.0000 mg | ORAL_TABLET | Freq: Three times a day (TID) | ORAL | Status: DC | PRN

## 2015-10-06 MED ORDER — ACETAMINOPHEN 325 MG PO TABS
ORAL_TABLET | ORAL | Status: AC
  Administered 2015-10-06: 650 mg via ORAL
  Filled 2015-10-06: qty 2

## 2015-10-06 MED ORDER — ONDANSETRON 4 MG PO TBDP
4.0000 mg | ORAL_TABLET | Freq: Once | ORAL | Status: AC
  Administered 2015-10-06: 4 mg via ORAL

## 2015-10-06 NOTE — ED Provider Notes (Signed)
CSN: [id number]     Arrival date & time 10/06/15  2041 History  By signing my name below, I, Budd Palmer, attest that this documentation has been prepared under the direction and in the presence of Elizabelle Fite Randall An, MD. Electronically Signed: Budd Palmer, ED Scribe. 10/06/2015. 9:42 PM.      Chief Complaint  Patient presents with  . Nausea   The history is provided by the patient. No language interpreter was used.   HPI Comments: Laura Liu is a 35 y.o. female with a PMHx of low iron who presents to the Emergency Department complaining of nausea onset 1 day ago. She reports associated weakness and headache (resolved). She has tried taking Pepto Bismol and drinking Ginger Ale, as well as eating crackers, without relief. She denies taking ibuprofen today, but did take BC Powder for HA, which resolved afterwards. Pt denies vomiting, abdominal pain, chills, fever, sore throat, dysuria, cough, and congestion.   Past Medical History  Diagnosis Date  . Low iron    Past Surgical History  Procedure Laterality Date  . Bunionectomy     No family history on file. Social History  Substance Use Topics  . Smoking status: Never Smoker   . Smokeless tobacco: Never Used  . Alcohol Use: No     Comment: occ   OB History    No data available     Review of Systems  Constitutional: Negative for fever and chills.  HENT: Negative for congestion and sore throat.   Respiratory: Negative for cough.   Gastrointestinal: Positive for nausea. Negative for vomiting and abdominal pain.  Genitourinary: Negative for dysuria.  Neurological: Positive for weakness and headaches.  All other systems reviewed and are negative.   Allergies  Review of patient's allergies indicates no known allergies.  Home Medications   Prior to Admission medications   Medication Sig Start Date End Date Taking? Authorizing Provider  hydrocortisone cream 1 % Reported on 09/20/2015 05/17/15 05/16/16  Historical  Provider, MD  Meloxicam 7.5 MG/5ML SUSP Take 5 mLs (7.5 mg total) by mouth 1 day or 1 dose. Patient not taking: Reported on 09/20/2015 06/01/15   Myeong Christianne Dolin, DPM  metroNIDAZOLE (FLAGYL) 500 MG tablet Reported on 09/20/2015 03/22/15   Historical Provider, MD  Multiple Vitamin (MULTI-VITAMINS) TABS Take 1 capsule by mouth. Reported on 09/20/2015    Historical Provider, MD  naproxen (NAPROSYN) 500 MG tablet Take 500 mg by mouth 2 (two) times daily with a meal. Reported on 09/20/2015    Historical Provider, MD  ondansetron (ZOFRAN) 4 MG tablet Take 1 tablet (4 mg total) by mouth every 8 (eight) hours as needed for nausea or vomiting. 10/06/15   Dayson Aboud Lyn Romey Mathieson, MD  terconazole (TERAZOL 3) 0.8 % vaginal cream Reported on 09/20/2015 05/18/15   Historical Provider, MD   BP 130/99 mmHg  Pulse 74  Temp(Src) 98.4 F (36.9 C) (Oral)  Resp 16  Ht 5\' 6"  (1.676 m)  Wt 110 lb (49.896 kg)  BMI 17.76 kg/m2  SpO2 100% Physical Exam  Constitutional: She is oriented to person, place, and time. She appears well-developed and well-nourished.  HENT:  Head: Normocephalic and atraumatic.  Eyes: Conjunctivae are normal. Right eye exhibits no discharge. Left eye exhibits no discharge.  Pulmonary/Chest: Effort normal. No respiratory distress.  Abdominal: Soft. There is no tenderness.  Neurological: She is alert and oriented to person, place, and time. Coordination normal.  Skin: Skin is warm and dry. No rash noted. She is not diaphoretic.  No erythema.  Psychiatric: She has a normal mood and affect.  Nursing note and vitals reviewed.   ED Course  Procedures  DIAGNOSTIC STUDIES: Oxygen Saturation is 100% on RA, normal by my interpretation.    COORDINATION OF CARE: 9:36 PM - Discussed negative pregnancy test. Discussed plans to order Zofran and a CBC. Pt advised of plan for treatment and pt agrees.  Labs Review Labs Reviewed  URINALYSIS, ROUTINE W REFLEX MICROSCOPIC (NOT AT [facility]) - Abnormal; Notable  for the following:    Specific Gravity, Urine 1.004 (*)    All other components within normal limits  CBC WITH DIFFERENTIAL/PLATELET - Abnormal; Notable for the following:    Lymphs Abs 4.6 (*)    All other components within normal limits  PREGNANCY, URINE  COMPREHENSIVE METABOLIC PANEL    Imaging Review No results found. I have personally reviewed and evaluated these images and lab results as part of my medical decision-making.   EKG Interpretation None      MDM   Final diagnoses:  Nausea    Patient is a 35 year old female with history of low iron presenting to the emergency department today complaining of nausea for 1 day. Patient has mild nausea. No other symptoms. She is not vomited. No diarrhea. Fevers. No cough or congestion. She does want to be tested for low iron again today. We will let her know that we will not send iron studies because his emergency department we'll check hemoglobin today. We will give her Zofran by mouth. We'll see if she can tolerate crackers as well as she has been tolerating them at home. We will discharge her home with prescription of Zofran. She has normal physical exam and normal vital signs. Return precautions expressed and understood by patient.  I personally performed the services described in this documentation, which was scribed in my presence. The recorded information has been reviewed and is accurate.   10:40 PM Hgb level normal. Tolerated PO with zofran.  Will have her follow up with PCP.   Dione Mccombie Randall AnLyn London Nonaka, MD 10/06/15 2240

## 2015-10-06 NOTE — Discharge Instructions (Signed)
°  We are unsure what is causing yoru nause.  Please return with fever or abdominal pain.  P[place]ase follow up with your PCP for iron studies, your hemeglobin (blood level) is normal today.   Nausea, Adult Nausea is the feeling that you have an upset stomach or have to vomit. Nausea by itself is not likely a serious concern, but it may be an early sign of more serious medical problems. As nausea gets worse, it can lead to vomiting. If vomiting develops, there is the risk of dehydration.  CAUSES   Viral infections.  Food poisoning.  Medicines.  Pregnancy.  Motion sickness.  Migraine headaches.  Emotional distress.  Severe pain from any source.  Alcohol intoxication. HOME CARE INSTRUCTIONS  Get plenty of rest.  Ask your caregiver about specific rehydration instructions.  Eat small amounts of food and sip liquids more often.  Take all medicines as told by your caregiver. SEEK MEDICAL CARE IF:  You have not improved after 2 days, or you get worse.  You have a headache. SEEK IMMEDIATE MEDICAL CARE IF:   You have a fever.  You faint.  You keep vomiting or have blood in your vomit.  You are extremely weak or dehydrated.  You have dark or bloody stools.  You have severe chest or abdominal pain. MAKE SURE YOU:  Understand these instructions.  Will watch your condition.  Will get help right away if you are not doing well or get worse.   This information is not intended to replace advice given to you by your health care provider. Make sure you discuss any questions you have with your health care provider.   Document Released: 10/19/2004 Document Revised: 10/02/2014 Document Reviewed: 05/24/2011 Elsevier Interactive Patient Education [employer]2016 Elsevier Inc.

## 2015-10-06 NOTE — ED Notes (Signed)
Nausea since yesterday.   Denies vomiting or diarrhea.  Denies pregnancy.

## 2015-10-15 ENCOUNTER — Ambulatory Visit: Payer: Medicaid Other | Admitting: *Deleted

## 2015-10-15 DIAGNOSIS — M79673 Pain in unspecified foot: Secondary | ICD-10-CM

## 2015-10-15 NOTE — Progress Notes (Signed)
Patient ID: Laura Liu, female   DOB: [birth year], 35 y.o.   MRN: [id number] Patient presents for orthotic pick up.  Verbal and written break in and wear instructions given.  Patient will follow up in 4 weeks if symptoms worsen or fail to improve.

## 2015-10-15 NOTE — Patient Instructions (Signed)

## 2015-11-05 ENCOUNTER — Encounter: Payer: Self-pay | Admitting: Podiatry

## 2015-11-05 ENCOUNTER — Ambulatory Visit (INDEPENDENT_AMBULATORY_CARE_PROVIDER_SITE_OTHER): Payer: Medicaid Other | Admitting: Podiatry

## 2015-11-05 VITALS — BP 114/82 | HR 86 | Resp 16

## 2015-11-05 DIAGNOSIS — M21612 Bunion of left foot: Secondary | ICD-10-CM

## 2015-11-05 DIAGNOSIS — M779 Enthesopathy, unspecified: Secondary | ICD-10-CM

## 2015-11-05 DIAGNOSIS — M216X2 Other acquired deformities of left foot: Secondary | ICD-10-CM | POA: Diagnosis not present

## 2015-11-08 NOTE — Progress Notes (Signed)
Subjective:     Patient ID: Laura Liu, female   DOB: Dec 25, 1980, 35 y.o.   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 We will continue to strive for conservative care and she is advised wears thick issues as possible continue orthotics and I debrided lesions at this time

## 2015-12-03 ENCOUNTER — Telehealth: Payer: Self-pay | Admitting: *Deleted

## 2015-12-03 NOTE — Telephone Encounter (Addendum)
Pt left name, DOB and phone number.  I spoke with pt she states her supervisor said she needs a note to be exempt from the 8 hour shift program.  I told pt if she was continuing to have pain after conservative care, she need to come in to discuss alternative and work modification and notes with Dr. Charlsie Merlesegal.  Pt agreed and was transferred to schedulers.  12/29/2015-Pt asked when she could remove her surgical dressing.  I told pt to leave in place until she was seen on Friday.  Pt states understanding.

## 2015-12-10 ENCOUNTER — Encounter: Payer: Self-pay | Admitting: Podiatry

## 2015-12-10 ENCOUNTER — Ambulatory Visit (INDEPENDENT_AMBULATORY_CARE_PROVIDER_SITE_OTHER): Payer: Medicaid Other | Admitting: Podiatry

## 2015-12-10 ENCOUNTER — Ambulatory Visit (INDEPENDENT_AMBULATORY_CARE_PROVIDER_SITE_OTHER): Payer: Medicaid Other

## 2015-12-10 VITALS — BP 132/92 | HR 69 | Resp 12

## 2015-12-10 DIAGNOSIS — M79671 Pain in right foot: Secondary | ICD-10-CM

## 2015-12-10 DIAGNOSIS — M2010 Hallux valgus (acquired), unspecified foot: Secondary | ICD-10-CM | POA: Diagnosis not present

## 2015-12-10 DIAGNOSIS — Z472 Encounter for removal of internal fixation device: Secondary | ICD-10-CM

## 2015-12-10 NOTE — Progress Notes (Signed)
Subjective:     Patient ID: Laura PicklerKrystal Liu, female   DOB: Dec 10, 1980, 35 y.o.   MRN: [id number]  HPI patient states I started develop discomfort and pain on top of my right foot around the metatarsal shaft and it feels like there is a prominence here which can be bothersome. My foot still hurts in general but this place seems to be worse   Review of Systems     Objective:   Physical Exam Vascular status intact with continued tendinitis-like discomfort and callus formation bilateral that are fairly under control at the current time. On the shaft of the first metatarsal right there is a prominence of the metatarsal in the mid shaft area and it is tender when I pressed around this area with diffuse pain in other areas still noted    Assessment:     Prominence of the pin position right which may be causing part of the patient's symptoms but continues to have other symptoms noted    Plan:     H&P and condition reviewed along with x-ray. I do think the pin has moved in a fundamental fashion and it is where the patient has this new or tight pain and I have recommended that this patient Pin removal and I allow patient to read consent form going over risk. I do not expect this to solve all problems but patient wants to have the procedure understanding risk and at this time I allowed her to review alternative treatments complications and she signs consent form. Scheduled for outpatient surgery and was given all preoperative instructions  X-ray report indicates that the pin has moved in a dorsal direction and it is directly where the patient is experiencing the pain in the prominence of the pin position

## 2015-12-10 NOTE — Patient Instructions (Signed)
Pre-Operative Instructions  Congratulations, you have decided to take an important step to improving your quality of life.  You can be assured that the doctors of [facility] will be with you every step of the way.  1. Plan to be at the surgery center/hospital at least 1 (one) hour prior to your scheduled time unless otherwise directed by the surgical center/hospital staff.  You must have a responsible adult accompany you, remain during the surgery and drive you home.  Make sure you have directions to the surgical center/hospital and know how to get there on time. 2. For hospital based surgery you will need to obtain a history and physical form from your family physician within 1 month prior to the date of surgery- we will give you a form for you primary physician.  3. We make every effort to accommodate the date you request for surgery.  There are however, times where surgery dates or times have to be moved.  We will contact you as soon as possible if a change in schedule is required.   4. No Aspirin/Ibuprofen for one week before surgery.  If you are on aspirin, any non-steroidal anti-inflammatory medications (Mobic, Aleve, Ibuprofen) you should stop taking it 7 days prior to your surgery.  You make take Tylenol  For pain prior to surgery.  5. Medications- If you are taking daily heart and blood pressure medications, seizure, reflux, allergy, asthma, anxiety, pain or diabetes medications, make sure the surgery center/hospital is aware before the day of surgery so they may notify you which medications to take or avoid the day of surgery. 6. No food or drink after midnight the night before surgery unless directed otherwise by surgical center/hospital staff. 7. No alcoholic beverages 24 hours prior to surgery.  No smoking 24 hours prior to or 24 hours after surgery. 8. Wear loose pants or shorts- loose enough to fit over bandages, boots, and casts. 9. No slip on shoes, sneakers are best. 10. Bring  your boot with you to the surgery center/hospital.  Also bring crutches or a walker if your physician has prescribed it for you.  If you do not have this equipment, it will be provided for you after surgery. 11. If you have not been contracted by the surgery center/hospital by the day before your surgery, call to confirm the date and time of your surgery. 12. Leave-time from work may vary depending on the type of surgery you have.  Appropriate arrangements should be made prior to surgery with your employer. 13. Prescriptions will be provided immediately following surgery by your doctor.  Have these filled as soon as possible after surgery and take the medication as directed. 14. Remove nail polish on the operative foot. 15. Wash the night before surgery.  The night before surgery wash the foot and leg well with the antibacterial soap provided and water paying special attention to beneath the toenails and in between the toes.  Rinse thoroughly with water and dry well with a towel.  Perform this wash unless told not to do so by your physician.  Enclosed: 1 Ice pack (please put in freezer the night before surgery)   1 Hibiclens skin cleaner   Pre-op Instructions  If you have any questions regarding the instructions, do not hesitate to call our office.  [place]: [address] [place], [place] [address] [phone]  [place]: [address] [place], [place] [address] [phone]  [place]: [address]  [place], [place] [address] [phone]  Dr. Richard   Tuchman DPM, Dr. Lakelynn Severtson DPM Dr. Richard Sikora DPM, Dr. M. Todd Hyatt DPM, Dr. Kathryn Egerton DPM 

## 2015-12-21 DIAGNOSIS — M2021 Hallux rigidus, right foot: Secondary | ICD-10-CM | POA: Diagnosis not present

## 2015-12-31 ENCOUNTER — Ambulatory Visit: Payer: Self-pay

## 2015-12-31 ENCOUNTER — Ambulatory Visit (INDEPENDENT_AMBULATORY_CARE_PROVIDER_SITE_OTHER): Payer: Medicaid Other | Admitting: Podiatry

## 2015-12-31 DIAGNOSIS — Z472 Encounter for removal of internal fixation device: Secondary | ICD-10-CM

## 2015-12-31 DIAGNOSIS — Z9889 Other specified postprocedural states: Secondary | ICD-10-CM

## 2016-01-02 NOTE — Progress Notes (Signed)
Subjective:     Patient ID: Laura Liu, female   DOB: 1980/11/07, [age] y.o.   MRN: [id number]  HPI patient states I'm doing fine   Review of Systems     Objective:   Physical Exam Neurovascular status intact with well-healed surgical site right first metatarsal shaft    Assessment:     Doing well post pin removal right    Plan:     Stitches removed with wound edges coapted well and bandage applied with instructions to use Band-Aids for the next week. Continue being careful with this and reviewed x-rays with patient and discharge at this time  X-ray report indicates that the pin has been removed satisfactorily and that there is no pathology in this portion of her bowel

## 2016-01-14 ENCOUNTER — Encounter: Payer: Self-pay | Admitting: Podiatry

## 2016-01-14 ENCOUNTER — Ambulatory Visit (INDEPENDENT_AMBULATORY_CARE_PROVIDER_SITE_OTHER): Payer: Medicaid Other | Admitting: Podiatry

## 2016-01-14 DIAGNOSIS — M7750 Other enthesopathy of unspecified foot: Secondary | ICD-10-CM

## 2016-01-14 DIAGNOSIS — M779 Enthesopathy, unspecified: Secondary | ICD-10-CM

## 2016-01-14 MED ORDER — MELOXICAM 15 MG PO TABS: 15.0000 mg | ORAL_TABLET | Freq: Every day | ORAL | Status: DC

## 2016-01-16 NOTE — Progress Notes (Signed)
Subjective:     Patient ID: Laura Liu, female   DOB: 12/12/1980, 35 y.o.   MRN: [id number]  HPI patient presents concerned she still having some mild discomfort in her right foot   Review of Systems     Objective:   Physical Exam Neurovascular status intact muscle strength adequate with incision site well healed with no pain when removed pin it previous visit    Assessment:     Significant structural malalignment of the right foot which is a part of her pathological process    Plan:     I advised her on the possibility for forefoot reconstruction that I would recommend she have done at a medical institution and I have given her instructions on utilizing [facility] for this particular problem

## 2016-04-12 ENCOUNTER — Emergency Department (HOSPITAL_BASED_OUTPATIENT_CLINIC_OR_DEPARTMENT_OTHER): Payer: Medicaid Other

## 2016-04-12 ENCOUNTER — Encounter (HOSPITAL_BASED_OUTPATIENT_CLINIC_OR_DEPARTMENT_OTHER): Payer: Self-pay

## 2016-04-12 ENCOUNTER — Emergency Department (HOSPITAL_BASED_OUTPATIENT_CLINIC_OR_DEPARTMENT_OTHER)
Admission: EM | Admit: 2016-04-12 | Discharge: 2016-04-12 | Disposition: A | Discharge status: Home / Self Care | Payer: Medicaid Other | Attending: Emergency Medicine | Admitting: Emergency Medicine

## 2016-04-12 DIAGNOSIS — S61012A Laceration without foreign body of left thumb without damage to nail, initial encounter: Secondary | ICD-10-CM | POA: Insufficient documentation

## 2016-04-12 DIAGNOSIS — Y999 Unspecified external cause status: Secondary | ICD-10-CM | POA: Diagnosis not present

## 2016-04-12 DIAGNOSIS — T148XXA Other injury of unspecified body region, initial encounter: Secondary | ICD-10-CM

## 2016-04-12 DIAGNOSIS — T07XXXA Unspecified multiple injuries, initial encounter: Secondary | ICD-10-CM

## 2016-04-12 DIAGNOSIS — W108XXA Fall (on) (from) other stairs and steps, initial encounter: Secondary | ICD-10-CM | POA: Diagnosis not present

## 2016-04-12 DIAGNOSIS — M25562 Pain in left knee: Secondary | ICD-10-CM | POA: Insufficient documentation

## 2016-04-12 DIAGNOSIS — Y9339 Activity, other involving climbing, rappelling and jumping off: Secondary | ICD-10-CM | POA: Diagnosis not present

## 2016-04-12 DIAGNOSIS — Y929 Unspecified place or not applicable: Secondary | ICD-10-CM | POA: Diagnosis not present

## 2016-04-12 DIAGNOSIS — S60312A Abrasion of left thumb, initial encounter: Secondary | ICD-10-CM | POA: Diagnosis present

## 2016-04-12 MED ORDER — IBUPROFEN 800 MG PO TABS
800.0000 mg | ORAL_TABLET | Freq: Once | ORAL | Status: AC
  Administered 2016-04-12: 800 mg via ORAL
  Filled 2016-04-12: qty 1

## 2016-04-12 NOTE — ED Provider Notes (Signed)
CSN: [id number]     Arrival date & time 04/12/16  1849 History   First MD Initiated Contact with Patient 04/12/16 1857     Chief Complaint  Patient presents with  . Fall      HPI  Patient presents evaluation after a trip and fall. She was climbing up some steps. She cut her foot. She landed on a flexed right knee. She injured the dorsum of her left thumb. She's not sure how her thumb hit the ground. She states she felt like she fell on outstretched hands. She has a superficial abrasion/laceration to the dorsum of the left thumb and a scrape and contusion to her left knee. She is able to 40 on the knee.  Past Medical History  Diagnosis Date  . Low iron    Past Surgical History  Procedure Laterality Date  . Bunionectomy     No family history on file. Social History  Substance Use Topics  . Smoking status: Never Smoker   . Smokeless tobacco: Never Used  . Alcohol Use: No     Comment: occ   OB History    No data available     Review of Systems  Constitutional: Negative for fever, chills, diaphoresis, appetite change and fatigue.  HENT: Negative for mouth sores, sore throat and trouble swallowing.   Eyes: Negative for visual disturbance.  Respiratory: Negative for cough, chest tightness, shortness of breath and wheezing.   Cardiovascular: Negative for chest pain.  Gastrointestinal: Negative for nausea, vomiting, abdominal pain, diarrhea and abdominal distention.  Endocrine: Negative for polydipsia, polyphagia and polyuria.  Genitourinary: Negative for dysuria, frequency and hematuria.  Musculoskeletal: Negative for gait problem.       Left knee pain. Left thumb pain.  Skin: Positive for wound. Negative for color change, pallor and rash.  Neurological: Negative for dizziness, syncope, light-headedness and headaches.  Hematological: Does not bruise/bleed easily.  Psychiatric/Behavioral: Negative for behavioral problems and confusion.      Allergies  Review of patient's  allergies indicates no known allergies.  Home Medications   Prior to Admission medications   Not on File   BP 139/96 mmHg  Pulse 89  Temp(Src) 98.9 F (37.2 C) (Oral)  Resp 18  Ht 5\' 5"  (1.651 m)  Wt 110 lb (49.896 kg)  BMI 18.31 kg/m2  SpO2 100% Physical Exam  Constitutional: She is oriented to person, place, and time. She appears well-developed and well-nourished. No distress.  HENT:  Head: Normocephalic.  Eyes: Conjunctivae are normal. Pupils are equal, round, and reactive to light. No scleral icterus.  Neck: Normal range of motion. Neck supple. No thyromegaly present.  Cardiovascular: Normal rate and regular rhythm.  Exam reveals no gallop and no friction rub.   No murmur heard. Pulmonary/Chest: Effort normal and breath sounds normal. No respiratory distress. She has no wheezes. She has no rales.  Abdominal: Soft. Bowel sounds are normal. She exhibits no distension. There is no tenderness. There is no rebound.  Musculoskeletal: Normal range of motion.       Hands:      Legs: Neurological: She is alert and oriented to person, place, and time.  Skin: Skin is warm and dry. No rash noted.  Psychiatric: She has a normal mood and affect. Her behavior is normal.    ED Course  Procedures (including critical care time) Labs Review Labs Reviewed - No data to display  Imaging Review Dg Finger Thumb Left  04/12/2016  CLINICAL DATA:  Laceration over the IP  joint. EXAM: LEFT THUMB 2+V COMPARISON:  None. FINDINGS: No soft tissue laceration is noted over the dorsal aspect of the IP joint. There is no underlying fracture. The joint is intact. No radiopaque foreign body is present. IMPRESSION: Soft tissue laceration over the dorsal aspect of the thumb without an underlying fracture. Electronically Signed   By: Marin Roberts M.D.   On: 04/12/2016 19:41   I have personally reviewed and evaluated these images and lab results as part of my medical decision-making.   EKG  Interpretation None      MDM   Final diagnoses:  Abrasions of multiple sites  Abrasion    Normal from x-ray. Wounds dressed. Discharged home. Basic wound care.    Rolland Porter, MD 04/12/16 [phone]

## 2016-04-12 NOTE — ED Notes (Addendum)
Tripped/fell appox 5pm today-pain to left knee, left thumb-NAD- steady gait-no pain meds PTA

## 2016-04-12 NOTE — ED Notes (Signed)
Pt's mother requested ice pack for pt thumb-small ice pack given-[facility]

## 2016-04-12 NOTE — Discharge Instructions (Signed)
Keep wounds clean and dry.  Apply antibiotic ointment and Band-Aid daily

## 2016-05-19 ENCOUNTER — Encounter: Payer: Self-pay | Admitting: *Deleted

## 2016-05-19 NOTE — Progress Notes (Signed)
   DOS 12-21-15  Removal internal fixation right

## 2017-06-09 ENCOUNTER — Emergency Department (HOSPITAL_BASED_OUTPATIENT_CLINIC_OR_DEPARTMENT_OTHER)
Admission: EM | Admit: 2017-06-09 | Discharge: 2017-06-09 | Disposition: A | Discharge status: Home / Self Care | Payer: Medicaid Other | Attending: Emergency Medicine | Admitting: Emergency Medicine

## 2017-06-09 ENCOUNTER — Encounter (HOSPITAL_BASED_OUTPATIENT_CLINIC_OR_DEPARTMENT_OTHER): Payer: Self-pay

## 2017-06-09 DIAGNOSIS — R42 Dizziness and giddiness: Secondary | ICD-10-CM | POA: Diagnosis not present

## 2017-06-09 LAB — URINALYSIS, ROUTINE W REFLEX MICROSCOPIC
Bilirubin Urine: NEGATIVE
GLUCOSE, UA: NEGATIVE mg/dL
KETONES UR: NEGATIVE mg/dL
LEUKOCYTES UA: NEGATIVE
Nitrite: NEGATIVE
PROTEIN: NEGATIVE mg/dL
pH: 6 (ref 5.0–8.0)

## 2017-06-09 LAB — BASIC METABOLIC PANEL
ANION GAP: 7 (ref 5–15)
BUN: 5 mg/dL — ABNORMAL LOW (ref 6–20)
CALCIUM: 9 mg/dL (ref 8.9–10.3)
CHLORIDE: 106 mmol/L (ref 101–111)
CO2: 24 mmol/L (ref 22–32)
CREATININE: 0.78 mg/dL (ref 0.44–1.00)
GFR calc non Af Amer: >60 mL/min (ref >60)
Glucose, Bld: 95 mg/dL (ref 65–99)
Potassium: 3.7 mmol/L (ref 3.5–5.1)
SODIUM: 137 mmol/L (ref 135–145)

## 2017-06-09 LAB — URINALYSIS, MICROSCOPIC (REFLEX): WBC, UA: NONE SEEN WBC/hpf (ref 0–5)

## 2017-06-09 LAB — CBC
HCT: 36.9 % (ref 36.0–46.0)
HEMOGLOBIN: 12.5 g/dL (ref 12.0–15.0)
MCH: 31.5 pg (ref 26.0–34.0)
MCHC: 33.9 g/dL (ref 30.0–36.0)
MCV: 92.9 fL (ref 78.0–100.0)
PLATELETS: 234 10*3/uL (ref 150–400)
RBC: 3.97 MIL/uL (ref 3.87–5.11)
RDW: 12.7 % (ref 11.5–15.5)
WBC: 7.4 10*3/uL (ref 4.0–10.5)

## 2017-06-09 LAB — CBG MONITORING, ED: Glucose-Capillary: 92 mg/dL (ref 65–99)

## 2017-06-09 LAB — PREGNANCY, URINE: Preg Test, Ur: NEGATIVE

## 2017-06-09 MED ORDER — MECLIZINE HCL 25 MG PO TABS
25.0000 mg | ORAL_TABLET | Freq: Once | ORAL | Status: AC
  Administered 2017-06-09: 25 mg via ORAL
  Filled 2017-06-09: qty 1

## 2017-06-09 MED ORDER — SODIUM CHLORIDE 0.9 % IV BOLUS (SEPSIS)
1000.0000 mL | Freq: Once | INTRAVENOUS | Status: AC
Start: 2017-06-09 — End: 2017-06-09
  Administered 2017-06-09: 1000 mL via INTRAVENOUS

## 2017-06-09 MED ORDER — MECLIZINE HCL 25 MG PO TABS: 25.0000 mg | ORAL_TABLET | Freq: Three times a day (TID) | ORAL | 0 refills | Status: DC | PRN

## 2017-06-09 NOTE — ED Notes (Signed)
Patient instructed on discharge instructions. Patient denies any further requests.

## 2017-06-09 NOTE — ED Provider Notes (Signed)
[facility]   CSN: [id number] Arrival date & time: 06/09/17  1114     History   Chief Complaint Chief Complaint  Patient presents with  . Dizziness    HPI Laura Liu is a 36 y.o. female.He presents emergency Department with chief complaint of lightheadedness and fatigue. Patient states that she has been feeling lightheaded when she stands the past 2 days. She denies significant volume loss, vertigo, or other neurologic symptoms. She has history of chronic pain as evidenced by review of her outside records however she states she is not taking any opiate medications. 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 She states that she eats well and has regular bowel movements. She denies any recent volume loss or changes in medication.  HPI  Past Medical History:  Diagnosis Date  . Low iron     Patient Active Problem List   Diagnosis Date Noted  . Pain in foot 08/07/2014  . 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 Bunion 07/28/2014    Past Surgical History:  Procedure Laterality Date  . BUNIONECTOMY      OB History    No data available       Home Medications    Prior to Admission medications   Medication Sig Start Date End Date Taking? Authorizing Provider  meclizine (ANTIVERT) 25 MG tablet Take 1 tablet (25 mg total) by mouth 3 (three) times daily as needed for dizziness. 06/09/17   Arthor Captain, PA-C    Family History History reviewed. No pertinent family history.  Social History Social History  Substance Use Topics  . Smoking status: Never Smoker  . Smokeless tobacco: Never Used  . Alcohol use No     Comment: occ     Allergies   Patient has no known allergies.   Review of Systems Review of Systems  Ten systems reviewed and are negative for acute change, except as noted in the HPI.   Physical Exam Updated Vital Signs BP (!) 148/101 (BP Location: Left Arm)   Pulse 80   Temp 98.2 F (36.8  C) (Oral)   Resp 14   Ht  (1.626 m)   Wt 49.9 kg (110 lb)   SpO2 100%   BMI 18.88 kg/m   Physical Exam  Constitutional: She is oriented to person, place, and time. She appears well-developed and well-nourished. No distress.  HENT:  Head: Normocephalic and atraumatic.  Mouth/Throat: Oropharynx is clear and moist.  Eyes: Pupils are equal, round, and reactive to light. Conjunctivae and EOM are normal. No scleral icterus.  No horizontal, vertical or rotational nystagmus  Neck: Normal range of motion. Neck supple.  Full active and passive ROM without pain No midline or paraspinal tenderness No nuchal rigidity or meningeal signs  Cardiovascular: Normal rate, regular rhythm and intact distal pulses.   Pulmonary/Chest: Effort normal and breath sounds normal. No respiratory distress. She has no wheezes. She has no rales.  Abdominal: Soft. Bowel sounds are normal. There is no tenderness. There is no rebound and no guarding.  Musculoskeletal: Normal range of motion.  Lymphadenopathy:    She has no cervical adenopathy.  Neurological: She is alert and oriented to person, place, and time. No cranial nerve deficit. She exhibits normal muscle tone. Coordination normal.  Mental Status:  Alert, oriented, thought content appropriate. Speech fluent without evidence of aphasia. Able to follow 2 step commands without difficulty.  Cranial Nerves:  II:  Peripheral visual fields grossly normal,  pupils equal, round, reactive to light III,IV, VI: ptosis not present, extra-ocular motions intact bilaterally  V,VII: smile symmetric, facial light touch sensation equal VIII: hearing grossly normal bilaterally  IX,X: midline uvula rise  XI: bilateral shoulder shrug equal and strong XII: midline tongue extension  Motor:  5/5 in upper and lower extremities bilaterally including strong and equal grip strength and dorsiflexion/plantar flexion Sensory: Pinprick and light touch normal in all extremities.    Cerebellar: normal finger-to-nose with bilateral upper extremities Gait: normal gait and balance CV: distal pulses palpable throughout   Skin: Skin is warm and dry. No rash noted. She is not diaphoretic.  Psychiatric: She has a normal mood and affect. Her behavior is normal. Judgment and thought content normal.  Nursing note and vitals reviewed.    ED Treatments / Results  Labs (all labs ordered are listed, but only abnormal results are displayed) Labs Reviewed  BASIC METABOLIC PANEL - Abnormal; Notable for the following:       Result Value   BUN 5 (*)    All other components within normal limits  URINALYSIS, ROUTINE W REFLEX MICROSCOPIC - Abnormal; Notable for the following:    Specific Gravity, Urine <1.005 (*)    Hgb urine dipstick TRACE (*)    All other components within normal limits  URINALYSIS, MICROSCOPIC (REFLEX) - Abnormal; Notable for the following:    Bacteria, UA MANY (*)    Squamous Epithelial / LPF 6-30 (*)    All other components within normal limits  CBC  PREGNANCY, URINE  CBG MONITORING, ED    EKG  EKG Interpretation  Date/Time:  Saturday June 09 2017 11:33:00 EDT Ventricular Rate:  91 PR Interval:    QRS Duration: 95 QT Interval:  360 QTC Calculation: 443 R Axis:   75 Text Interpretation:  Sinus rhythm Probable left atrial enlargement Baseline wander in lead(s) III V5 NO STEMI No old tracing to compare Confirmed by Drema Pry [phone]) on 06/09/2017 2:13:10 PM       Radiology No results found.  Procedures Procedures (including critical care time)  Medications Ordered in ED Medications  meclizine (ANTIVERT) tablet 25 mg (25 mg Oral Given 06/09/17 1503)  sodium chloride 0.9 % bolus 1,000 mL (0 mLs Intravenous Stopped 06/09/17 1625)     Initial Impression / Assessment and Plan / ED Course  I have reviewed the triage vital signs and the nursing notes.  Pertinent labs & imaging results that were available during my care of the patient  were reviewed by me and considered in my medical decision making (see chart for details).     Patient with positive orthostatic vital signs. Given fluids with improvement in symptoms. Discharged with meclizine, follow-up with PCP, EKG without significant abnormality, labs without significant abnormality. She appears safe for discharge at this time  Final Clinical Impressions(s) / ED Diagnoses   Final diagnoses:  Light-headedness    New Prescriptions Discharge Medication List as of 06/09/2017  3:51 PM    START taking these medications   Details  meclizine (ANTIVERT) 25 MG tablet Take 1 tablet (25 mg total) by mouth 3 (three) times daily as needed for dizziness., Starting Sat 06/09/2017, Print         Tiburcio Pea Brighton, PA-C 06/09/17 1732    Nira Conn, MD 06/12/17 [phone]

## 2017-06-09 NOTE — ED Notes (Signed)
Pt on cardiac monitor and auto VS 

## 2017-06-09 NOTE — ED Triage Notes (Signed)
Patient reports dizziness starting 06/08/17. Patient reports to fatigue.

## 2017-06-09 NOTE — Discharge Instructions (Signed)
Contact a health care provider if: °You vomit. °You have diarrhea. °You have a fever for more than 2-3 days. °You feel more thirsty than usual. °You feel weak and tired. °Get help right away if: °You have chest pain. °You have a fast or irregular heartbeat. °You develop numbness in any part of your body. °You cannot move your arms or your legs. °You have trouble speaking. °You become sweaty or feel light-headed. °You faint. °You feel short of breath. °You have trouble staying awake. °You feel confused. °

## 2018-01-16 ENCOUNTER — Ambulatory Visit: Payer: Medicaid Other | Admitting: Podiatry

## 2018-01-16 DIAGNOSIS — M216X2 Other acquired deformities of left foot: Secondary | ICD-10-CM | POA: Diagnosis not present

## 2018-01-16 DIAGNOSIS — Q828 Other specified congenital malformations of skin: Secondary | ICD-10-CM | POA: Diagnosis not present

## 2018-01-16 DIAGNOSIS — M21969 Unspecified acquired deformity of unspecified lower leg: Secondary | ICD-10-CM

## 2018-01-16 DIAGNOSIS — M216X1 Other acquired deformities of right foot: Secondary | ICD-10-CM

## 2018-01-16 NOTE — Patient Instructions (Signed)
Seen for pain in right foot. Noted of build up callus under the first and 5th Metatarsal joint. All lesions on both feet debrided. Need to wear lace up and well supported shoes for flat and unstable feet. May return as needed.

## 2018-01-16 NOTE — Progress Notes (Signed)
Subjective: 37 year old female presents complaining of right foot pain. Patient is wearing flat sandals. Pain on right foot for over several months and getting worse and more frequent. Patient points callus build up at plantar medial aspect of the first MPJ of right foot hurt.  Past history reveals she has had bunionectomy on right foot in April 2015 at another clinic.  Stated that since then right great toe joint is painful and her feet get tired easily and cannot stand on feet longer than one hour before she must sit.  Objective: Dermatologic: Plantar medial callus under the first and 5th MPJ area right with pain. Vascular: All pedal pulses are palpable. Orthopedic: Dorsiflexed first MPJ right following bunionectomy, April 2015. 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 Flexible flat foot bilateral.   Assessment: Painful callus under first and 5th MPJ right foot. STJ hyperpronation with hypermobile first ray and lateral weight shifting.   Plan: Reviewed findings and available treatment options. Painful lesion debrided. Proper shoe gear reviewed. Return as needed.

## 2018-01-17 ENCOUNTER — Encounter: Payer: Self-pay | Admitting: Podiatry

## 2018-10-13 ENCOUNTER — Other Ambulatory Visit: Payer: Self-pay

## 2018-10-13 ENCOUNTER — Emergency Department (HOSPITAL_BASED_OUTPATIENT_CLINIC_OR_DEPARTMENT_OTHER): Payer: Medicaid Other

## 2018-10-13 ENCOUNTER — Emergency Department (HOSPITAL_BASED_OUTPATIENT_CLINIC_OR_DEPARTMENT_OTHER)
Admission: EM | Admit: 2018-10-13 | Discharge: 2018-10-13 | Disposition: A | Discharge status: Home / Self Care | Payer: Medicaid Other | Attending: Emergency Medicine | Admitting: Emergency Medicine

## 2018-10-13 ENCOUNTER — Encounter (HOSPITAL_BASED_OUTPATIENT_CLINIC_OR_DEPARTMENT_OTHER): Payer: Self-pay | Admitting: Emergency Medicine

## 2018-10-13 DIAGNOSIS — J40 Bronchitis, not specified as acute or chronic: Secondary | ICD-10-CM | POA: Insufficient documentation

## 2018-10-13 DIAGNOSIS — J3489 Other specified disorders of nose and nasal sinuses: Secondary | ICD-10-CM | POA: Insufficient documentation

## 2018-10-13 DIAGNOSIS — R0981 Nasal congestion: Secondary | ICD-10-CM | POA: Insufficient documentation

## 2018-10-13 DIAGNOSIS — R05 Cough: Secondary | ICD-10-CM | POA: Diagnosis present

## 2018-10-13 DIAGNOSIS — R0789 Other chest pain: Secondary | ICD-10-CM | POA: Insufficient documentation

## 2018-10-13 MED ORDER — PREDNISONE 50 MG PO TABS
60.0000 mg | ORAL_TABLET | Freq: Once | ORAL | Status: AC
  Administered 2018-10-13: 60 mg via ORAL
  Filled 2018-10-13: qty 1

## 2018-10-13 MED ORDER — PREDNISONE 20 MG PO TABS: 60.0000 mg | ORAL_TABLET | Freq: Every day | ORAL | 0 refills | Status: AC

## 2018-10-13 MED ORDER — IPRATROPIUM-ALBUTEROL 0.5-2.5 (3) MG/3ML IN SOLN
3.0000 mL | Freq: Once | RESPIRATORY_TRACT | Status: AC
  Administered 2018-10-13: 3 mL via RESPIRATORY_TRACT
  Filled 2018-10-13: qty 3

## 2018-10-13 NOTE — ED Triage Notes (Signed)
Pt c/o productive cough x 2 months.

## 2018-10-13 NOTE — Discharge Instructions (Signed)
Your symptoms are likely due to bronchitis, use albuterol inhaler every 4 hours as needed and take prednisone once daily with food as directed.  You can also take Flonase and Zyrtec to help with postnasal drip and Mucinex DM to help with cough.  If symptoms are not improving follow-up with your primary care doctor.  If you develop fevers, chest pain, shortness of breath, worsening cough or any other new or concerning symptoms return to the emergency department.

## 2018-10-13 NOTE — ED Provider Notes (Signed)
[facility] EMERGENCY DEPARTMENT Provider Note   CSN: [id number] Arrival date & time: 10/13/18  1710     History   Chief Complaint Chief Complaint  Patient presents with  . Cough    HPI Laura Liu is a 38 y.o. female.  Laura Liu is a 38 y.o. female who is otherwise healthy, presents to the emergency department for evaluation of 1 month of persistent cough.  She reports initially the cough was dry and nonproductive and associated with some nasal congestion and rhinorrhea but over the past 2 weeks cough has become increasingly productive of clear to yellow/green sputum.  She reports some chest soreness from persistent coughing but no persistent chest pain or shortness of breath.  She has not had any fevers or chills.  Continues to have some postnasal drip but denies sore throat.  No associated abdominal pain, nausea or vomiting.  Saw her primary care provider for this and was started on omeprazole thinking that GERD may be contributing also given albuterol inhaler, reports she has not used the albuterol inhaler much at all.  She is also intermittently taken some DayQuil without relief.  No history of reactive airway disease.  Has never been a smoker.     Past Medical History:  Diagnosis Date  . Low iron     Patient Active Problem List   Diagnosis Date Noted  . Pain in foot 08/07/2014  . 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 Bunion 07/28/2014    Past Surgical History:  Procedure Laterality Date  . BUNIONECTOMY       OB History   No obstetric history on file.      Home Medications    Prior to Admission medications   Medication Sig Start Date End Date Taking? Authorizing Provider  meclizine (ANTIVERT) 25 MG tablet Take 1 tablet (25 mg total) by mouth 3 (three) times daily as needed for dizziness. 06/09/17   Arthor Captain, PA-C    Family History No family history on file.  Social History Social History   Tobacco Use  . Smoking status: Never  Smoker  . Smokeless tobacco: Never Used  Substance Use Topics  . Alcohol use: No    Alcohol/week: 0.0 standard drinks  . Drug use: No     Allergies   Patient has no known allergies.   Review of Systems Review of Systems  Constitutional: Negative for chills and fever.  HENT: Positive for congestion, postnasal drip and rhinorrhea. Negative for ear discharge and sore throat.   Respiratory: Positive for cough. Negative for chest tightness, shortness of breath and wheezing.   Cardiovascular: Negative for chest pain.  Gastrointestinal: Negative for abdominal pain, rectal pain and vomiting.  Genitourinary: Negative for dysuria and frequency.  Musculoskeletal: Negative for arthralgias and myalgias.  Skin: Negative for color change and rash.  All other systems reviewed and are negative.    Physical Exam Updated Vital Signs BP 139/90 (BP Location: Right Arm)   Pulse 90   Temp 98.8 F (37.1 C) (Oral)   Resp 18   Ht 5\' 4"  (1.626 m)   Wt 50.3 kg   LMP  (LMP Unknown)   SpO2 100%   BMI 19.05 kg/m   Physical Exam Vitals signs and nursing note reviewed.  Constitutional:      General: She is not in acute distress.    Appearance: She is well-developed. She is not ill-appearing or diaphoretic.  HENT:     Head: Normocephalic and atraumatic.     Right Ear:  Tympanic membrane and ear canal normal.     Left Ear: Tympanic membrane and ear canal normal.     Nose: Congestion and rhinorrhea present.     Comments: Bilateral nares with moderate congestion and clear rhinorrhea present.    Mouth/Throat:     Mouth: Mucous membranes are moist.     Pharynx: Oropharynx is clear. Posterior oropharyngeal erythema present. No oropharyngeal exudate.  Eyes:     General:        Right eye: No discharge.        Left eye: No discharge.  Neck:     Musculoskeletal: Neck supple.     Comments: No rigidity Cardiovascular:     Rate and Rhythm: Normal rate and regular rhythm.     Heart sounds: Normal  heart sounds. No murmur. No friction rub. No gallop.   Pulmonary:     Effort: Pulmonary effort is normal. No respiratory distress.     Breath sounds: Normal breath sounds.     Comments: Respirations equal and unlabored, patient able to speak in full sentences, lungs clear to auscultation bilaterally although patient does have somewhat tight with decreased air movement throughout Abdominal:     General: Abdomen is flat. Bowel sounds are normal. There is no distension.     Palpations: Abdomen is soft. There is no mass.     Tenderness: There is no abdominal tenderness. There is no guarding.     Comments: Abdomen soft, nondistended, nontender to palpation in all quadrants without guarding or peritoneal signs  Musculoskeletal:        General: No deformity.  Lymphadenopathy:     Cervical: No cervical adenopathy.  Skin:    General: Skin is warm and dry.     Capillary Refill: Capillary refill takes less than 2 seconds.  Neurological:     Mental Status: She is alert and oriented to person, place, and time.  Psychiatric:        Mood and Affect: Mood normal.        Behavior: Behavior normal.      ED Treatments / Results  Labs (all labs ordered are listed, but only abnormal results are displayed) Labs Reviewed - No data to display  EKG None  Radiology Dg Chest 2 View  Result Date: 10/13/2018 CLINICAL DATA:  Persistent cough. EXAM: CHEST - 2 VIEW COMPARISON:  April 11, 2014 FINDINGS: The heart size and mediastinal contours are within normal limits. Both lungs are clear. The visualized skeletal structures are unremarkable. IMPRESSION: No active cardiopulmonary disease. Electronically Signed   By: Gerome Samavid  Williams III M.D   On: 10/13/2018 18:50    Procedures Procedures (including critical care time)  Medications Ordered in ED Medications  ipratropium-albuterol (DUONEB) 0.5-2.5 (3) MG/3ML nebulizer solution 3 mL (3 mLs Nebulization Given 10/13/18 1918)  predniSONE (DELTASONE) tablet 60 mg  (60 mg Oral Given 10/13/18 1914)     Initial Impression / Assessment and Plan / ED Course  I have reviewed the triage vital signs and the nursing notes.  Pertinent labs & imaging results that were available during my care of the patient were reviewed by me and considered in my medical decision making (see chart for details).  Presents for evaluation of 1 month of persistent cough that is since become productive.  No associated fevers on arrival patient with normal vitals and overall well-appearing.  She has had some intermittent nasal congestion and postnasal drip associated with this denies any chest pain or shortness of breath.  On exam  lungs are clear but patient sounds tight with some decreased air movement.  She does have some postnasal drip and mild nasal congestion.  Abdominal exam is benign.  I suspect patient likely had a viral upper respiratory infection is now experiencing some degree of postinfectious reactive bronchitis especially giving tightness on exam.  Will give breathing treatment and start patient on course of prednisone.  I have also recommended that patient begin using Flonase and Zyrtec to help with nasal congestion and reduction of postnasal drip.  Patient has already been provided albuterol inhaler by her primary care doctor.  After breathing treatment here in the emergency department patient with improved air movement.  Discussed supportive treatment at home and encourage patient to use albuterol inhaler every 4 hours over the next 24 hours and then as needed.  Provided prednisone burst.  Return precautions discussed, encourage PCP follow-up.  Patient discharged home in good condition.  Final Clinical Impressions(s) / ED Diagnoses   Final diagnoses:  Bronchitis    ED Discharge Orders         Ordered    predniSONE (DELTASONE) 20 MG tablet  Daily     10/13/18 1937           Legrand RamsFord, Reeshemah Nazaryan N, PA-C 10/13/18 2010    Rolan BuccoBelfi, Melanie, MD 10/13/18 2159

## 2018-10-14 ENCOUNTER — Telehealth (HOSPITAL_BASED_OUTPATIENT_CLINIC_OR_DEPARTMENT_OTHER): Payer: Self-pay | Admitting: Emergency Medicine

## 2019-08-30 ENCOUNTER — Emergency Department (HOSPITAL_BASED_OUTPATIENT_CLINIC_OR_DEPARTMENT_OTHER)
Admission: EM | Admit: 2019-08-30 | Discharge: 2019-08-30 | Disposition: A | Discharge status: Home / Self Care | Payer: Medicaid Other | Attending: Emergency Medicine | Admitting: Emergency Medicine

## 2019-08-30 ENCOUNTER — Other Ambulatory Visit: Payer: Self-pay

## 2019-08-30 DIAGNOSIS — M62838 Other muscle spasm: Secondary | ICD-10-CM | POA: Diagnosis not present

## 2019-08-30 DIAGNOSIS — R6884 Jaw pain: Secondary | ICD-10-CM | POA: Insufficient documentation

## 2019-08-30 DIAGNOSIS — Z79899 Other long term (current) drug therapy: Secondary | ICD-10-CM | POA: Insufficient documentation

## 2019-08-30 LAB — CBC WITH DIFFERENTIAL/PLATELET
Abs Immature Granulocytes: 0.13 10*3/uL — ABNORMAL HIGH (ref 0.00–0.07)
Basophils Absolute: 0 10*3/uL (ref 0.0–0.1)
Basophils Relative: 0 %
Eosinophils Absolute: 0 10*3/uL (ref 0.0–0.5)
Eosinophils Relative: 0 %
HCT: 45.1 % (ref 36.0–46.0)
Hemoglobin: 14.4 g/dL (ref 12.0–15.0)
Immature Granulocytes: 1 %
Lymphocytes Relative: 9 %
Lymphs Abs: 1.6 10*3/uL (ref 0.7–4.0)
MCH: 29.9 pg (ref 26.0–34.0)
MCHC: 31.9 g/dL (ref 30.0–36.0)
MCV: 93.6 fL (ref 80.0–100.0)
Monocytes Absolute: 0.3 10*3/uL (ref 0.1–1.0)
Monocytes Relative: 1 %
Neutro Abs: 16.1 10*3/uL — ABNORMAL HIGH (ref 1.7–7.7)
Neutrophils Relative %: 89 %
Platelets: 339 10*3/uL (ref 150–400)
RBC: 4.82 MIL/uL (ref 3.87–5.11)
RDW: 13.4 % (ref 11.5–15.5)
WBC: 18 10*3/uL — ABNORMAL HIGH (ref 4.0–10.5)
nRBC: 0 % (ref 0.0–0.2)

## 2019-08-30 LAB — BASIC METABOLIC PANEL
Anion gap: 11 (ref 5–15)
BUN: 10 mg/dL (ref 6–20)
CO2: 22 mmol/L (ref 22–32)
Calcium: 9.7 mg/dL (ref 8.9–10.3)
Chloride: 104 mmol/L (ref 98–111)
Creatinine, Ser: 0.83 mg/dL (ref 0.44–1.00)
GFR calc Af Amer: >60 mL/min (ref >60)
GFR calc non Af Amer: >60 mL/min (ref >60)
Glucose, Bld: 116 mg/dL — ABNORMAL HIGH (ref 70–99)
Potassium: 4.4 mmol/L (ref 3.5–5.1)
Sodium: 137 mmol/L (ref 135–145)

## 2019-08-30 MED ORDER — BACLOFEN 10 MG PO TABS: 10.0000 mg | ORAL_TABLET | Freq: Three times a day (TID) | ORAL | 0 refills | Status: AC

## 2019-08-30 MED ORDER — KETOROLAC TROMETHAMINE 15 MG/ML IJ SOLN
15.0000 mg | Freq: Once | INTRAMUSCULAR | Status: AC
  Administered 2019-08-30: 15 mg via INTRAVENOUS
  Filled 2019-08-30: qty 1

## 2019-08-30 MED ORDER — DIAZEPAM 5 MG/ML IJ SOLN
5.0000 mg | Freq: Once | INTRAMUSCULAR | Status: AC
  Administered 2019-08-30: 5 mg via INTRAVENOUS
  Filled 2019-08-30: qty 2

## 2019-08-30 NOTE — ED Provider Notes (Addendum)
[facility] EMERGENCY DEPARTMENT Provider Note   CSN: [id number] Arrival date & time: 08/30/19  1804     History   Chief Complaint Chief Complaint  Patient presents with  . Jaw Pain  . Spasms    HPI Laura Liu is a 38 y.o. female.     Patient presenting with 1 week of left jaw spasm, significantly worse today.  Patient was able to take her birth control pill but really has not been able tolerate p.o. today.  Denies any history of any jaw trauma or other musculoskeletal facial issues.  It is only on the left side.  Patient's mother is at bedside.  Patient very uncomfortable and has difficulty talking giving the spasm.  Patient does have history of this when she was pregnant.  Patient already has follow-up with regular doctor and dentistry next week.  Patient denies any injury or skin breakage.  She has not had a tetanus vaccination in the past year.     Past Medical History:  Diagnosis Date  . Low iron     Patient Active Problem List   Diagnosis Date Noted  . Pain in foot 08/07/2014  . 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 Bunion 07/28/2014    Past Surgical History:  Procedure Laterality Date  . BUNIONECTOMY       OB History   No obstetric history on file.      Home Medications    Prior to Admission medications   Medication Sig Start Date End Date Taking? Authorizing Provider  albuterol (VENTOLIN HFA) 108 (90 Base) MCG/ACT inhaler Inhale into the lungs. 10/11/17 08/12/20 Yes [provider]  fluticasone (FLONASE) 50 MCG/ACT nasal spray SPRAY 2 SPRAYS INTO EACH NOSTRIL EVERY DAY 04/06/17  Yes [provider]  meclizine (ANTIVERT) 25 MG tablet Take 1 tablet (25 mg total) by mouth 3 (three) times daily as needed for dizziness. 06/09/17   Margarita Mail, PA-C    Family History No family history on file.  Social History Social History   Tobacco Use  . Smoking status: Never Smoker  . Smokeless tobacco: Never Used  Substance Use  Topics  . Alcohol use: No    Alcohol/week: 0.0 standard drinks  . Drug use: No     Allergies   Patient has no known allergies.   Review of Systems Review of Systems Limited given patient's condition   Physical Exam Updated Vital Signs BP (!) 168/104 (BP Location: Right Arm)   Pulse 80   Temp 98.3 F (36.8 C)   Resp 18   Ht 5\' 4"  (1.626 m)   Wt 49.9 kg   LMP  (LMP Unknown)   SpO2 100%   BMI 18.88 kg/m   Physical Exam Constitutional:      General: She is in acute distress.  HENT:     Head: Normocephalic and atraumatic.     Nose: Nose normal.     Mouth/Throat:     Comments: Left masseter muscle actively spasming.  Patient has locked jaw only able to talk through her teeth.  Right does not display any active spasm Cardiovascular:     Rate and Rhythm: Normal rate and regular rhythm.  Pulmonary:     Effort: Pulmonary effort is normal.     Breath sounds: Normal breath sounds.  Skin:    General: Skin is warm and dry.  Neurological:     Mental Status: She is alert and oriented to person, place, and time.      ED Treatments /  Results  Labs (all labs ordered are listed, but only abnormal results are displayed) Labs Reviewed  BASIC METABOLIC PANEL - Abnormal; Notable for the following components:      Result Value   Glucose, Bld 116 (*)    All other components within normal limits  CBC WITH DIFFERENTIAL/PLATELET - Abnormal; Notable for the following components:   WBC 18.0 (*)    Neutro Abs 16.1 (*)    Abs Immature Granulocytes 0.13 (*)    All other components within normal limits    EKG None  Radiology No results found.  Procedures Procedures (including critical care time)  Medications Ordered in ED Medications  diazepam (VALIUM) injection 5 mg (5 mg Intravenous Given 08/30/19 1905)  ketorolac (TORADOL) 15 MG/ML injection 15 mg (15 mg Intravenous Given 08/30/19 1956)     Initial Impression / Assessment and Plan / ED Course  I have reviewed the  triage vital signs and the nursing notes.  Pertinent labs & imaging results that were available during my care of the patient were reviewed by me and considered in my medical decision making (see chart for details).        Patient presenting with left jaw spasming and lockjaw.  Also has some hypertension.  Otherwise vital signs stable.  Given history likely just recurrence of muscle spasm from TMJ.  Concern for possible tetanus, although less likely given unilateral presentation.  Does not appear to have any airway obstruction.  Will give IV Valium.  Patient needs outpatient follow-up with ENT.  Spasm improved after IV Valium.  BMP normal. CBC elevated, possibly due to acute pain and stress from muscle spasm. ROS neg for infectious symptoms. Continued headache, will give Toradol.  Patient will do well to have liquid diet for 48 hours while waiting to follow-up with PCP. Discussed with patient regarding leukocytosis, recommeded recheck in 1-2 weeks.   Final Clinical Impressions(s) / ED Diagnoses   Final diagnoses:  Muscle spasm    ED Discharge Orders    None       Garnette Gunner, MD 08/30/19 2004    Garnette Gunner, MD 08/30/19 Valrie Hart    Tilden Fossa, MD 08/30/19 2321    Tilden Fossa, MD 08/30/19 2324

## 2019-08-30 NOTE — ED Notes (Signed)
ED Provider at bedside. Dr. Rees 

## 2019-08-30 NOTE — ED Notes (Signed)
ED Provider at bedside. Resident MD 

## 2019-08-30 NOTE — ED Triage Notes (Signed)
Pt reports left side jaw pain with muscle spasms for greater than 1 week, with worsening today. Pt reports pain responds to otc meds, but spasms are not resolved

## 2019-08-30 NOTE — Discharge Instructions (Addendum)
Recommend liquid diet for 2 days.  May take ibuprofen and baclofen as needed for pain. Please follow up with your regular doctor and dentist as previously discussed. If no improvement, may follow up with ENT.

## 2019-09-19 ENCOUNTER — Other Ambulatory Visit: Payer: Self-pay

## 2019-09-19 ENCOUNTER — Emergency Department (HOSPITAL_BASED_OUTPATIENT_CLINIC_OR_DEPARTMENT_OTHER)
Admission: EM | Admit: 2019-09-19 | Discharge: 2019-09-19 | Disposition: A | Discharge status: Home / Self Care | Payer: Medicaid Other | Attending: Emergency Medicine | Admitting: Emergency Medicine

## 2019-09-19 ENCOUNTER — Encounter (HOSPITAL_BASED_OUTPATIENT_CLINIC_OR_DEPARTMENT_OTHER): Payer: Self-pay | Admitting: *Deleted

## 2019-09-19 DIAGNOSIS — M26622 Arthralgia of left temporomandibular joint: Secondary | ICD-10-CM | POA: Diagnosis present

## 2019-09-19 DIAGNOSIS — M26629 Arthralgia of temporomandibular joint, unspecified side: Secondary | ICD-10-CM

## 2019-09-19 MED ORDER — DIAZEPAM 5 MG PO TABS
5.0000 mg | ORAL_TABLET | Freq: Once | ORAL | Status: AC
  Administered 2019-09-19: 19:00:00 5 mg via ORAL
  Filled 2019-09-19: qty 1

## 2019-09-19 NOTE — ED Triage Notes (Signed)
Left sided TMJ spasms today after chewing ham at lunch. Hx of same

## 2019-09-19 NOTE — Discharge Instructions (Signed)
You were seen in the emergency department today with spasming of the jaw.  Please keep your appointment next week with Dr. Hoyt Koch.  I provided Valium today but you can continue your baclofen starting tomorrow morning at home.  Please return to the emergency department if you develop any headache, change in vision, or other severe symptoms.

## 2019-09-19 NOTE — ED Provider Notes (Signed)
Emergency Department Provider Note   I have reviewed the triage vital signs and the nursing notes.   HISTORY  Chief Complaint Temporomandibular Joint Pain   HPI Laura Liu is a 38 y.o. female returns to the ED with TMJ spasm/pain. She has been seen for this in the past but has had flare today. She typically take Baclofen but today was not at home and could not take her typical dose. Symptoms started while chewing. She has f/u with oral surgery next week. She denies any HA, vision change, or jaw claudication. She describes this as spasm type pain which releases and then feels better. No radiation or other modifying factors.     Past Medical History:  Diagnosis Date  . Low iron     Patient Active Problem List   Diagnosis Date Noted  . Pain in foot 08/07/2014  . 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 Bunion 07/28/2014    Past Surgical History:  Procedure Laterality Date  . BUNIONECTOMY      Allergies Patient has no known allergies.  History reviewed. No pertinent family history.  Social History Social History   Tobacco Use  . Smoking status: Never Smoker  . Smokeless tobacco: Never Used  Substance Use Topics  . Alcohol use: No    Alcohol/week: 0.0 standard drinks  . Drug use: No    Review of Systems  Constitutional: No fever/chills Eyes: No visual changes. ENT: No sore throat. Left jaw pain.  Cardiovascular: Denies chest pain. Respiratory: Denies shortness of breath. Gastrointestinal: No abdominal pain.   Musculoskeletal: Negative for back pain. Skin: Negative for rash. Neurological: Negative for headaches.  10-point ROS otherwise negative.  ____________________________________________   PHYSICAL EXAM:  VITAL SIGNS: ED Triage Vitals  Enc Vitals Group     BP 09/19/19 1851 (!) 160/101     Pulse Rate 09/19/19 1851 89     Resp 09/19/19 1851 18     Temp 09/19/19 1851 98.8 F (37.1 C)     Temp Source 09/19/19 1851 Oral     SpO2 09/19/19 1851 96  %     Weight 09/19/19 1849 109 lb (49.4 kg)     Height 09/19/19 1849 5\' 4"  (1.626 m)   Constitutional: Alert and oriented. Well appearing and in no acute distress. Eyes: Conjunctivae are normal. PERRL. EOMI. Head: Atraumatic. No tenderness over the temporal artery distribution.  Nose: No congestion/rhinnorhea. Mouth/Throat: Mucous membranes are moist.  Oropharynx non-erythematous. Normal occlusion. No trismus. Masseter spasm felt during exam.  Neck: No stridor.  Cardiovascular: Normal rate, regular rhythm. Good peripheral circulation. Grossly normal heart sounds.   Respiratory: Normal respiratory effort.   Gastrointestinal:  No distention.  Musculoskeletal: No gross deformities of extremities. Neurologic:  Normal speech and language.  Skin:  Skin is warm, dry and intact. No rash noted  ____________________________________________   PROCEDURES  Procedure(s) performed:   Procedures  None ____________________________________________   INITIAL IMPRESSION / ASSESSMENT AND PLAN / ED COURSE  Pertinent labs & imaging results that were available during my care of the patient were reviewed by me and considered in my medical decision making (see chart for details).   Patient presents to the ED with masseter spasm. Exam/history not consistent with giant cell arteritis or jaw dislocation. Patient with specialist follow up this week. Baclofen working at home but was away from her Rx. Will have her continue this medication at home and f/u with Dr. Hoyt Koch as scheduled next week. Valium given in the ED for acute symptoms. Patient  pleased at discharge.    ____________________________________________  FINAL CLINICAL IMPRESSION(S) / ED DIAGNOSES  Final diagnoses:  TMJ pain dysfunction syndrome     MEDICATIONS GIVEN DURING THIS VISIT:  Medications  diazepam (VALIUM) tablet 5 mg (5 mg Oral Given 09/19/19 1922)    Note:  This document was prepared using Dragon voice recognition software  and may include unintentional dictation errors.  Alona Bene, MD, [facility] Emergency Medicine    Elizabethanne Lusher, Arlyss Repress, MD 09/20/19 1124

## 2019-09-22 ENCOUNTER — Other Ambulatory Visit: Payer: Self-pay | Admitting: Family Medicine

## 2019-10-06 ENCOUNTER — Other Ambulatory Visit: Payer: Self-pay | Admitting: Family Medicine

## 2020-04-01 ENCOUNTER — Encounter (HOSPITAL_BASED_OUTPATIENT_CLINIC_OR_DEPARTMENT_OTHER): Payer: Self-pay | Admitting: Emergency Medicine

## 2020-04-01 ENCOUNTER — Emergency Department (HOSPITAL_BASED_OUTPATIENT_CLINIC_OR_DEPARTMENT_OTHER)
Admission: EM | Admit: 2020-04-01 | Discharge: 2020-04-01 | Disposition: A | Discharge status: Home / Self Care | Payer: Medicaid Other | Attending: Emergency Medicine | Admitting: Emergency Medicine

## 2020-04-01 ENCOUNTER — Other Ambulatory Visit: Payer: Self-pay

## 2020-04-01 DIAGNOSIS — Z79899 Other long term (current) drug therapy: Secondary | ICD-10-CM | POA: Insufficient documentation

## 2020-04-01 DIAGNOSIS — R350 Frequency of micturition: Secondary | ICD-10-CM | POA: Diagnosis not present

## 2020-04-01 DIAGNOSIS — R110 Nausea: Secondary | ICD-10-CM | POA: Insufficient documentation

## 2020-04-01 DIAGNOSIS — R519 Headache, unspecified: Secondary | ICD-10-CM | POA: Insufficient documentation

## 2020-04-01 DIAGNOSIS — E860 Dehydration: Secondary | ICD-10-CM

## 2020-04-01 LAB — BASIC METABOLIC PANEL
Anion gap: 8 (ref 5–15)
BUN: 7 mg/dL (ref 6–20)
CO2: 24 mmol/L (ref 22–32)
Calcium: 9.1 mg/dL (ref 8.9–10.3)
Chloride: 103 mmol/L (ref 98–111)
Creatinine, Ser: 0.8 mg/dL (ref 0.44–1.00)
GFR calc Af Amer: >60 mL/min (ref >60)
GFR calc non Af Amer: >60 mL/min (ref >60)
Glucose, Bld: 94 mg/dL (ref 70–99)
Potassium: 3.5 mmol/L (ref 3.5–5.1)
Sodium: 135 mmol/L (ref 135–145)

## 2020-04-01 LAB — URINALYSIS, MICROSCOPIC (REFLEX): WBC, UA: NONE SEEN WBC/hpf (ref 0–5)

## 2020-04-01 LAB — URINALYSIS, ROUTINE W REFLEX MICROSCOPIC
Bilirubin Urine: NEGATIVE
Glucose, UA: NEGATIVE mg/dL
Ketones, ur: NEGATIVE mg/dL
Leukocytes,Ua: NEGATIVE
Nitrite: NEGATIVE
Protein, ur: NEGATIVE mg/dL
Specific Gravity, Urine: 1.01 (ref 1.005–1.030)
pH: 7.5 (ref 5.0–8.0)

## 2020-04-01 LAB — CBC WITH DIFFERENTIAL/PLATELET
Abs Immature Granulocytes: 0.02 10*3/uL (ref 0.00–0.07)
Basophils Absolute: 0.1 10*3/uL (ref 0.0–0.1)
Basophils Relative: 1 %
Eosinophils Absolute: 0.1 10*3/uL (ref 0.0–0.5)
Eosinophils Relative: 1 %
HCT: 42.1 % (ref 36.0–46.0)
Hemoglobin: 13.7 g/dL (ref 12.0–15.0)
Immature Granulocytes: 0 %
Lymphocytes Relative: 52 %
Lymphs Abs: 4.9 10*3/uL — ABNORMAL HIGH (ref 0.7–4.0)
MCH: 30.7 pg (ref 26.0–34.0)
MCHC: 32.5 g/dL (ref 30.0–36.0)
MCV: 94.4 fL (ref 80.0–100.0)
Monocytes Absolute: 0.4 10*3/uL (ref 0.1–1.0)
Monocytes Relative: 4 %
Neutro Abs: 4 10*3/uL (ref 1.7–7.7)
Neutrophils Relative %: 42 %
Platelets: 335 10*3/uL (ref 150–400)
RBC: 4.46 MIL/uL (ref 3.87–5.11)
RDW: 13.2 % (ref 11.5–15.5)
WBC: 9.5 10*3/uL (ref 4.0–10.5)
nRBC: 0 % (ref 0.0–0.2)

## 2020-04-01 LAB — PREGNANCY, URINE: Preg Test, Ur: NEGATIVE

## 2020-04-01 MED ORDER — SODIUM CHLORIDE 0.9 % IV BOLUS
1000.0000 mL | Freq: Once | INTRAVENOUS | Status: AC
  Administered 2020-04-01: 1000 mL via INTRAVENOUS

## 2020-04-01 MED ORDER — ONDANSETRON HCL 4 MG/2ML IJ SOLN
4.0000 mg | Freq: Once | INTRAMUSCULAR | Status: AC
  Administered 2020-04-01: 4 mg via INTRAVENOUS
  Filled 2020-04-01: qty 2

## 2020-04-01 MED ORDER — ONDANSETRON 4 MG PO TBDP: 4.0000 mg | ORAL_TABLET | Freq: Three times a day (TID) | ORAL | 0 refills | Status: DC | PRN

## 2020-04-01 NOTE — ED Notes (Signed)
Pt laid flat for orthostatics 

## 2020-04-01 NOTE — ED Triage Notes (Signed)
C/o nausea x 1 week. Denies vomiting and diarrhea.

## 2020-04-01 NOTE — ED Provider Notes (Signed)
[facility] EMERGENCY DEPARTMENT Provider Note   CSN: [id number] Arrival date & time: 04/01/20  1937     History Chief Complaint  Patient presents with  . Nausea    Laura Liu is a 39 y.o. female who presents to the ED today with complaint of persistent nausea for the past week. Pt states that after the nausea started she began having a very mild HA. She has hx of headaches and states this feels similar. She does not typically get nauseated with her headaches. She has also been having some urinary frequency. Pt denies fevers, chills, abdominal pain, vomiting, diarrhea, constipation, pelvic pain, vaginal discharge, vision changes, photophobia, phonophobia, rash, neck stiffness, or any other associated symptoms. No recent sick contacts. Pt states she does not have periods as she is on birth control - she is on an OCP however she cannot recall the name. Per chart review pt is on JUNEL 1/20. She is sexually active with 1 female partner. They do not use protection. She has also missed doses of her birth control recently.   The history is provided by the patient and medical records.       Past Medical History:  Diagnosis Date  . Low iron     Patient Active Problem List   Diagnosis Date Noted  . Pain in foot 08/07/2014  . 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 Bunion 07/28/2014    Past Surgical History:  Procedure Laterality Date  . BUNIONECTOMY       OB History   No obstetric history on file.     No family history on file.  Social History   Tobacco Use  . Smoking status: Never Smoker  . Smokeless tobacco: Never Used  Vaping Use  . Vaping Use: Never used  Substance Use Topics  . Alcohol use: No    Alcohol/week: 0.0 standard drinks  . Drug use: No    Home Medications Prior to Admission medications   Medication Sig Start Date End Date Taking? Authorizing Provider  norethindrone-ethinyl estradiol (JUNEL 1/20) 1-20 MG-MCG tablet Take 1 tablet by mouth daily.  12/25/19  Yes [provider]  ondansetron (ZOFRAN ODT) 4 MG disintegrating tablet Take 1 tablet (4 mg total) by mouth every 8 (eight) hours as needed for nausea or vomiting. 04/01/20   Tanda Rockers, PA-C    Allergies    Patient has no known allergies.  Review of Systems   Review of Systems  Constitutional: Negative for chills and fever.  Eyes: Negative for photophobia and visual disturbance.  Gastrointestinal: Positive for nausea. Negative for abdominal pain, constipation, diarrhea and vomiting.  Genitourinary: Positive for frequency. Negative for dysuria, flank pain, hematuria, vaginal bleeding and vaginal discharge.  Musculoskeletal: Negative for neck pain and neck stiffness.  Skin: Negative for rash.  Neurological: Positive for headaches. Negative for syncope and light-headedness.  All other systems reviewed and are negative.   Physical Exam Updated Vital Signs BP (!) 144/113 (BP Location: Right Arm)   Pulse 86   Temp 99.5 F (37.5 C) (Oral)   Resp 16   Ht 5\' 4"  (1.626 m)   Wt 49.9 kg   SpO2 97%   BMI 18.88 kg/m   Physical Exam Vitals and nursing note reviewed.  Constitutional:      Appearance: She is not ill-appearing or diaphoretic.  HENT:     Head: Normocephalic and atraumatic.     Mouth/Throat:     Mouth: Mucous membranes are moist.  Eyes:     Conjunctiva/sclera:  Conjunctivae normal.  Cardiovascular:     Rate and Rhythm: Normal rate and regular rhythm.     Pulses: Normal pulses.  Pulmonary:     Effort: Pulmonary effort is normal.     Breath sounds: Normal breath sounds. No wheezing, rhonchi or rales.  Abdominal:     Palpations: Abdomen is soft.     Tenderness: There is no abdominal tenderness. There is no right CVA tenderness, left CVA tenderness, guarding or rebound.  Musculoskeletal:     Cervical back: Neck supple.  Skin:    General: Skin is warm and dry.  Neurological:     Mental Status: She is alert.     ED Results / Procedures /  Treatments   Labs (all labs ordered are listed, but only abnormal results are displayed) Labs Reviewed  URINALYSIS, ROUTINE W REFLEX MICROSCOPIC - Abnormal; Notable for the following components:      Result Value   Hgb urine dipstick TRACE (*)    All other components within normal limits  CBC WITH DIFFERENTIAL/PLATELET - Abnormal; Notable for the following components:   Lymphs Abs 4.9 (*)    All other components within normal limits  URINALYSIS, MICROSCOPIC (REFLEX) - Abnormal; Notable for the following components:   Bacteria, UA RARE (*)    All other components within normal limits  PREGNANCY, URINE  BASIC METABOLIC PANEL    EKG None  Radiology No results found.  Procedures Procedures (including critical care time)  Medications Ordered in ED Medications  ondansetron (ZOFRAN) injection 4 mg (4 mg Intravenous Given 04/01/20 2012)  sodium chloride 0.9 % bolus 1,000 mL (0 mLs Intravenous Stopped 04/01/20 2158)    ED Course  I have reviewed the triage vital signs and the nursing notes.  Pertinent labs & imaging results that were available during my care of the patient were reviewed by me and considered in my medical decision making (see chart for details).    MDM Rules/Calculators/A&P                          39 year old female presents to the ED today with complaint of nonspecific nausea for the past week.  She does report she felt a little lightheaded earlier in the week and has had some urinary frequency otherwise no complaints.  On arrival to the ED patient is afebrile, nontachycardic nontachypneic.  She appears to be no acute distress.  She has no focal abdominal tenderness on exam.  No specific findings on exam.  Will obtain screening labs at this time, provide Zofran for nausea, obtain orthostatics and reevaluate.  Urine pregnancy test negative. UA without signs of infection. CBC without leukocytosis.  Hemoglobin stable. BMP without electrolyte  abnormalities.  Orthostatics positive.  Will provide fluids and reassess.   She received fluids and Zofran.  States her symptoms have improved.  I suspect symptoms secondary to dehydration.  Patient states she does not drink too much water on a regular basis and does drink caffeine from coffee and teas.  Have instructed she increase her water intake.  Will discharge home with Zofran.  Patient instructed to follow-up with her PCP.  Have instructed that if she started having any symptoms including abdominal pain, vomiting, diarrhea, fevers, any other new/concerning symptoms that she can return to the ED at any time.  She is in agreement with plan is stable for discharge home.   This note was prepared using Conservation officer, historic buildings and may include unintentional dictation  errors due to the inherent limitations of voice recognition software.  Final Clinical Impression(s) / ED Diagnoses Final diagnoses:  Nausea  Dehydration    Rx / DC Orders ED Discharge Orders         Ordered    ondansetron (ZOFRAN ODT) 4 MG disintegrating tablet  Every 8 hours PRN     Discontinue  Reprint     04/01/20 2159           Discharge Instructions     Please increase your water intake to stay hydrated.  I have prescribed Zofran for you to take as needed for nausea.    Follow up with your PCP regarding your ED visit today.   Return to the ED for any new/worsening symptoms.        Tanda Rockers, PA-C 04/01/20 2159    Virgina Norfolk, DO 04/01/20 2239

## 2020-04-01 NOTE — Discharge Instructions (Addendum)
Please increase your water intake to stay hydrated.  I have prescribed Zofran for you to take as needed for nausea.    Follow up with your PCP regarding your ED visit today.   Return to the ED for any new/worsening symptoms.

## 2020-06-19 LAB — GLUCOSE, POCT (MANUAL RESULT ENTRY): POC Glucose: 77 mg/dl (ref 70–99)

## 2021-01-19 ENCOUNTER — Encounter: Payer: Self-pay | Admitting: Registered"

## 2021-01-19 ENCOUNTER — Other Ambulatory Visit: Payer: Self-pay

## 2021-01-19 ENCOUNTER — Encounter: Payer: Medicaid Other | Attending: Physician Assistant | Admitting: Registered"

## 2021-01-19 DIAGNOSIS — R634 Abnormal weight loss: Secondary | ICD-10-CM

## 2021-01-19 NOTE — Progress Notes (Signed)
Medical Nutrition Therapy:  Appt start time: 1120 end time:  1220.  Assessment:  Primary concerns today: Pt referred due to abnormal weight loss. Pt present for appointment alone.   Reports her wt was around 100 lb years ago, highest she has weighed is 110 lb. Pt reports she has always been small. Pt reports she would like to gain weight, reports may be 115 lb or so or a healthy BMI range.   Reports she is always tired. Pt reports she goes to bed around 10 PM, bathroom break around 2-3 AM and then wakes 630-7 AM. Usually takes a nap in the afternoon. Reports dizziness if she waits too long to eat such as if she skips breakfast. Pt feels high sodium foods give her headaches.   Food Allergies/Intolerances: Reports high sodium foods give her a headache.   GI Concerns: Sometimes nausea with acidic or spicy foods. Heartburn with spicy foods. Reports lactose intolerance. Reports having cramping with Boost/Ensure as well. Reports has constipation if she eats cheese. Yogurt tolerated well if eaten in the morning, but not later int day.   Pertinent Lab Values: N/A  Weight Hx: Pt reports highest weight around 110 lb. Reports fluctuating from ~100 lb.   Body Composition Scale Date 01/19/21  Current Body Weight 103.1 lb   Total Body Fat % 10.8%  Visceral Fat 2  Fat-Free Mass % 89.1%   Total Body Water % 59.0%  Muscle-Mass lbs 30.4 lb  BMI 17.4  Body Fat Displacement          Torso  lbs 6.8         Left Leg  lbs 1.3         Right Leg  lbs 1.3         Left Arm  lbs 0.6         Right Arm   lbs 0.6   Preferred Learning Style:   No preference indicated   Learning Readiness:   Ready  MEDICATIONS: Reports vitamin D supplement.    DIETARY INTAKE:  Usual eating pattern includes 2 meals and 2-3 snacks per day. Reports eats breakfast (won't feel right if she skips it), sometimes skips lunch and then has dinner. May do snack for lunch. Sometimes does oatmeal and blueberries, toast, yogurt for  breakfast. Pt limits red meat, does [place] meat instead. Tries to include plant based foods more often.   Common foods: yogurt, oatmeal.  Avoided foods: hard tacos. Dairy: oatmilk, Activia (every morning)  Typical Snacks: yogurt, blueberries, ice cream/popsicle, cake icing. Pt reports sometimes eats cake icing for snack if she wants something sweet.   Typical Beverages: water x 1 Jove water, oatmeal with cereal or cookies or in coffee.   Location of Meals: separately usually.   Electronics Present at [employer]: Sometimes.   24-hr recall:  B ( AM): 1 small [place] sausage biscuit, orange juice, coffee with honey and oatmilk, water Snk ( AM): chicken salad and bread  L ( PM): shrimp pasta Snk ( PM): None reported.  D ( PM): [employer] part of chicken power bowl (black beans, lettuce, tomatoes, sour cream cheese, chicken, no rice-doesn't like the way they prepare it), 1 beef chalupa, root beer  Snk ( PM): None reported.  Beverages: water,   Usual physical activity: walking Minutes/Week: walking to/from bus stops.   Estimated energy needs: (Calucated using IBW of 120 lb):  2230 calories 251-362 g carbohydrates 44 g protein 50-87 g fat  Progress Towards Goal(s):  In  progress.   Nutritional Diagnosis:  NI-5.3 Inadequate protein-energy intake As related to low appetite.  As evidenced by pt's reported dietary recall and habits; BMI of 17.70.    Intervention:  Nutrition counseling provided. Dietitian provided education regarding high calorie nutrition therapy. Discussed using oils, dips, etc to increase calories in foods consumed such as having nut butter or icing with hear fruit (pt reports liking to eat icing alone). Discussed working to have lunch and if unable to have solid foods at that time due to low appetite-recommend having a supplemental drink. Provide pt with a sample of Molli Posey as pt reports Boost and Ensure causing cramping along with most other dairy containing foods. Pt  completed paperwork so Molli Posey can be covered by insurance if she likes it and it is tolerated well. Discussed having 1 as a snack and 1 at any meal time she doesn't feel like eating solid food.Recommend a complete multivitamin. Discussed spacing water apart from meals to ensure she is not filling up on water beforehand. Pt appeared agreeable to information/goals discussed.   Instructions/Goals:   Make sure to get in three meals per day. Try to have balanced meals like the My Plate example (see handout). Include lean proteins, vegetables, fruits, and whole grains at meals.   Goal: 3 meals per day and 2-3 snacks   If unable to eat at a meal, have a [employer] drink  Have 1 [employer] as a snack each day.   Add in high calorie dips, oils, sauces, dressings to foods as able.  Recommend a complete multivitamin. Can do Flintstones.   Water: Recommend 48 oz per day. Space at least 30 minutes from meals and may sip with meals.   Teaching Method Utilized:  Visual Auditory  Barriers to learning/adherence to lifestyle change: None reported.   Demonstrated degree of understanding via:  Teach Back   Monitoring/Evaluation:  Dietary intake, exercise, and body weight in 4 week(s).

## 2021-01-19 NOTE — Patient Instructions (Addendum)
Instructions/Goals:   Make sure to get in three meals per day. Try to have balanced meals like the My Plate example (see handout). Include lean proteins, vegetables, fruits, and whole grains at meals.   Goal: 3 meals per day and 2-3 snacks   If unable to eat at a meal, have a [employer] drink  Have 1 [employer] as a snack each day.   Add in high calorie dips, oils, sauces, dressings to foods as able.  Recommend a complete multivitamin. Can do Flintstones.   Water: Recommend 48 oz per day. Space at least 30 minutes from meals and may sip with meals.

## 2021-01-24 ENCOUNTER — Encounter: Payer: Self-pay | Admitting: Registered"

## 2021-02-09 ENCOUNTER — Ambulatory Visit: Payer: Medicaid Other | Admitting: Registered"

## 2021-03-08 ENCOUNTER — Other Ambulatory Visit: Payer: Self-pay

## 2021-03-08 ENCOUNTER — Encounter: Payer: Medicaid Other | Attending: Physician Assistant | Admitting: Registered"

## 2021-03-08 DIAGNOSIS — R634 Abnormal weight loss: Secondary | ICD-10-CM | POA: Diagnosis not present

## 2021-03-08 NOTE — Progress Notes (Signed)
Medical Nutrition Therapy:  Appt start time: 1500 end time:  1530.  Assessment:  Primary concerns today: Pt referred due to abnormal weight loss.   Nutrition Follow Up: Pt present for appointment with son.   Pt reports she has received and has been drinking the [employer] 1.0. Reports having half a bottle in her coffee in the morning and rest alone with breakfast. Reports drinking 2 per day. Reports if she doesn't feel like eating at a meal time she will drink one. Reports at first felt some bloating but once she got used to drinking them they set well on her stomach.  Reports appetite about the same as before. Reports she eats when hungry. If unable to eat at a meal time will have one of the [facility] Reports drinking 2-3 bottles water daily.   Weight History: Reports her wt was around 100 lb years ago, highest she has weighed is 110 lb. Pt reports she has always been small. Pt reports she would like to gain weight, reports may be 115 lb or so or a healthy BMI range.    Food Allergies/Intolerances: Reports high sodium foods give her a headache.   GI Concerns: Reports at first had some stomach issues with the [facility] but no longer since getting used to them. Sometimes nausea with acidic or spicy foods. Heartburn with spicy foods. Reports lactose intolerance. Reports having cramping with Boost/Ensure as well. Reports has constipation if she eats cheese. Yogurt tolerated well if eaten in the morning, but not later int day.   Pertinent Lab Values: N/A  Weight Hx: Pt reports highest weight around 110 lb. Reports fluctuating from ~100 lb.   Body Composition Scale Date 03/08/21  Current Body Weight 104 lb   Total Body Fat % 11.3%  Visceral Fat 2  Fat-Free Mass % 89.1%   Total Body Water % 58.8.0%  Muscle-Mass lbs 30.4 lb  BMI 17.4  Body Fat Displacement          Torso  lbs 6.8         Left Leg  lbs 1.3         Right Leg  lbs 1.3         Left Arm  lbs 0.6         Right Arm   lbs 0.6    Preferred Learning Style:  No preference indicated   Learning Readiness:  Ready  MEDICATIONS: Reports vitamin D supplement.    DIETARY INTAKE:  Usual eating pattern includes 2-3 meals and 2-3 snacks per day. Sometimes does oatmeal and blueberries, toast, yogurt for breakfast. Pt limits red meat, does [place] meat instead. Tries to include plant based foods more often.   Common foods: yogurt, oatmeal.  Avoided foods: hard tacos. Dairy: oatmilk, Activia (every morning)  Typical Snacks: yogurt, blueberries, ice cream/popsicle, cake icing. Pt reports sometimes eats cake icing for snack if she wants something sweet.   Typical Beverages: 2 Molli Posey, 2-3 bottles water.   Location of Meals: separately usually.   Electronics Present at [employer]: Sometimes.   24 Hour Recall:  Breakfast: 2 eggs, 1 cup instant grits + butter, toast with strawberry jelly, 1 [employer], coffee Snk: None reported.  Lunch: Molli Posey if not eating *pt unsure what she had may have had salmon patty  Snk: None reported.  Dinner: sandwich with tuna and mayo, 2 pieces, potato chips,  Snk: None reported.  Beverages: 2 Molli Posey  Today so far:   Breakfast:  Toast with butter and jelly, butter + grits, 1 [employer], coffee Lunch: Hot dog with bun, peaches, chocolate milk.   Usual physical activity: walking to bus stop and swimming at the pool.  Estimated energy needs: (Calucated using IBW of 120 lb):  2230 calories 251-362 g carbohydrates 44 g protein 50-87 g fat  Progress Towards Goal(s):  Some progress.   Nutritional Diagnosis:  NI-5.3 Inadequate protein-energy intake As related to low appetite.  As evidenced by pt's reported dietary recall and habits; BMI of 17.70.    Intervention:  Nutrition counseling provided. Dietitian praised pt for including the [employer] drinks regularly and working in [employer]. Discussed pt did gain 1 lb since last visit which is good to see especially with  pt reporting increase in activity with going to the pool. Discussed including 2 Molli Posey + 1 if unable to have a meal. Discussed ensuring 2-3 snacks each day. Pt appeared agreeable to information/goals discussed.   Instructions/Goals:   Make sure to get in three meals per day. Try to have balanced meals like the My Plate example (see handout). Include lean proteins, vegetables, fruits, and whole grains at meals.  Goal: 3 meals per day and 2-3 snacks  If unable to eat at a meal, have a [employer] drink Continue with 2 [employer] daily.  Add in high calorie dips, oils, sauces, dressings to foods as able.  Recommend a complete multivitamin. Can do Flintstones due to difficulty swallowing pills. Recommend asking your doctor about having a prescription for the vitamin so it can be covered by your insurance.   Water: Recommend 48 oz per day. Space at least 30 minutes from meals and may sip with meals.   Teaching Method Utilized:  Visual Auditory  Barriers to learning/adherence to lifestyle change: None reported.   Demonstrated degree of understanding via:  Teach Back   Monitoring/Evaluation:  Dietary intake, exercise, and body weight in 1 month(s).

## 2021-03-08 NOTE — Patient Instructions (Addendum)
Instructions/Goals:   Make sure to get in three meals per day. Try to have balanced meals like the My Plate example (see handout). Include lean proteins, vegetables, fruits, and whole grains at meals.  Goal: 3 meals per day and 2-3 snacks  If unable to eat at a meal, have a [employer] drink Continue with 2 [employer] daily.  Add in high calorie dips, oils, sauces, dressings to foods as able.  Recommend a complete multivitamin. Can do Flintstones due to difficulty swallowing pills. Recommend asking your doctor about having a prescription for the vitamin so it can be covered by your insurance.   Water: Recommend 48 oz per day. Space at least 30 minutes from meals and may sip with meals.

## 2021-03-14 ENCOUNTER — Encounter: Payer: Self-pay | Admitting: Registered"

## 2021-04-02 ENCOUNTER — Other Ambulatory Visit: Payer: Self-pay

## 2021-04-02 ENCOUNTER — Emergency Department (HOSPITAL_BASED_OUTPATIENT_CLINIC_OR_DEPARTMENT_OTHER)
Admission: EM | Admit: 2021-04-02 | Discharge: 2021-04-02 | Disposition: A | Discharge status: Home / Self Care | Payer: Medicaid Other | Attending: Emergency Medicine | Admitting: Emergency Medicine

## 2021-04-02 ENCOUNTER — Emergency Department (HOSPITAL_BASED_OUTPATIENT_CLINIC_OR_DEPARTMENT_OTHER): Payer: Medicaid Other

## 2021-04-02 DIAGNOSIS — S0101XA Laceration without foreign body of scalp, initial encounter: Secondary | ICD-10-CM

## 2021-04-02 DIAGNOSIS — W228XXA Striking against or struck by other objects, initial encounter: Secondary | ICD-10-CM | POA: Insufficient documentation

## 2021-04-02 DIAGNOSIS — S0990XA Unspecified injury of head, initial encounter: Secondary | ICD-10-CM | POA: Diagnosis present

## 2021-04-02 MED ORDER — ACETAMINOPHEN 160 MG/5ML PO SOLN
650.0000 mg | Freq: Once | ORAL | Status: AC
  Administered 2021-04-02: 650 mg via ORAL
  Filled 2021-04-02: qty 20.3

## 2021-04-02 MED ORDER — BACITRACIN ZINC 500 UNIT/GM EX OINT
TOPICAL_OINTMENT | Freq: Once | CUTANEOUS | Status: AC
  Filled 2021-04-02: qty 28.35

## 2021-04-02 NOTE — ED Provider Notes (Signed)
[facility] EMERGENCY DEPARTMENT Provider Note   CSN: [id number] Arrival date & time: 04/02/21  2018     History Chief Complaint  Patient presents with   Head Laceration    Laura Liu is a 40 y.o. female who presents with head injury. States was cleaning out the car and the vacuum cleaner fell and hit her on the head. Denies loss of cosciousness. Pain at the top of head where vacuum hit, and also right side of neck though she believes it was from the impact, and does not believe the vacuum fell there. Describes pain as throbbing and rates it 7/10. Has not taken anything for the pain.  Denies changes in vision, weakness, changes in sensation.     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     Family History  Problem Relation Age of Onset   Hypertension Mother    Cancer Other     Social History   Tobacco Use   Smoking status: Never   Smokeless tobacco: Never  Vaping Use   Vaping Use: Never used  Substance Use Topics   Alcohol use: No    Alcohol/week: 0.0 standard drinks   Drug use: No    Home Medications Prior to Admission medications   Medication Sig Start Date End Date Taking? Authorizing Provider  norethindrone-ethinyl estradiol (LOESTRIN) 1-20 MG-MCG tablet Take 1 tablet by mouth daily. 12/25/19   [provider]  ondansetron (ZOFRAN ODT) 4 MG disintegrating tablet Take 1 tablet (4 mg total) by mouth every 8 (eight) hours as needed for nausea or vomiting. Patient not taking: Reported on 01/19/2021 04/01/20   Tanda Rockers, PA-C  VITAMIN D PO Take by mouth.    [provider]    Allergies    Patient has no known allergies.  Review of Systems   Review of Systems   Eyes:  Negative for photophobia and visual disturbance.  Musculoskeletal:  Positive for neck pain.  Neurological:  Negative for dizziness, syncope, weakness and light-headedness.  All other systems reviewed and are negative.  Physical Exam Updated Vital Signs BP (!) 167/105 (BP Location: Left Arm)   Pulse 82   Temp 98.5 F (36.9 C) (Oral)   Resp 18   Ht 5\' 4"  (1.626 m)   Wt 48.5 kg   SpO2 98%   BMI 18.37 kg/m   Physical Exam Constitutional:      General: She is not in acute distress.    Appearance: Normal appearance.  HENT:     Head:     Comments: Area of her scalp on the top of her head with slight bleeding and with surrounding dried blood. Approx 2x2cm raised area under lesion  Eyes:     Extraocular Movements: Extraocular movements intact.     Conjunctiva/sclera: Conjunctivae normal.     Pupils: Pupils are equal, round, and reactive to light.  Cardiovascular:     Rate and Rhythm: Normal rate and regular rhythm.     Pulses: Normal pulses.     Heart sounds: Normal heart sounds.  Pulmonary:     Effort: Pulmonary effort is normal.     Breath sounds: Normal breath  sounds.  Abdominal:     General: There is no distension.     Palpations: Abdomen is soft.  Musculoskeletal:        General: Normal range of motion.     Cervical back: Normal range of motion and neck supple.  Skin:    General: Skin is warm and dry.  Neurological:     General: No focal deficit present.     Mental Status: She is alert and oriented to person, place, and time. Mental status is at baseline.     Cranial Nerves: No cranial nerve deficit.     Sensory: No sensory deficit.     Motor: No weakness.    ED Results / Procedures / Treatments   Labs (all labs ordered are listed, but only abnormal results are displayed) Labs Reviewed - No data to display  EKG None  Radiology CT Head Wo Contrast  Result Date: 04/02/2021 CLINICAL DATA:  40 year old female with head trauma. EXAM: CT HEAD WITHOUT  CONTRAST CT CERVICAL SPINE WITHOUT CONTRAST TECHNIQUE: Multidetector CT imaging of the head and cervical spine was performed following the standard protocol without intravenous contrast. Multiplanar CT image reconstructions of the cervical spine were also generated. COMPARISON:  None. FINDINGS: CT HEAD FINDINGS Brain: The ventricles and sulci are appropriate size for patient's age. The gray-white matter discrimination is preserved. There is no acute intracranial hemorrhage. No mass effect or midline shift no extra-axial fluid collection Vascular: No hyperdense vessel or unexpected calcification. Skull: Normal. Negative for fracture or focal lesion. Sinuses/Orbits: No acute finding. Other: Small right posterior parietal scalp contusion or focal thickening/scarring. CT CERVICAL SPINE FINDINGS Alignment: No acute subluxation. Skull base and vertebrae: No acute fracture. Soft tissues and spinal canal: No prevertebral fluid or swelling. No visible canal hematoma. Disc levels: No acute findings. No significant degenerative changes. Upper chest: Negative. Other: None IMPRESSION: 1. Normal noncontrast CT of the brain. 2. No acute/traumatic cervical spine pathology. Electronically Signed   By: Elgie Collard M.D.   On: 04/02/2021 21:33   CT Cervical Spine Wo Contrast  Result Date: 04/02/2021 CLINICAL DATA:  40 year old female with head trauma. EXAM: CT HEAD WITHOUT CONTRAST CT CERVICAL SPINE WITHOUT CONTRAST TECHNIQUE: Multidetector CT imaging of the head and cervical spine was performed following the standard protocol without intravenous contrast. Multiplanar CT image reconstructions of the cervical spine were also generated. COMPARISON:  None. FINDINGS: CT HEAD FINDINGS Brain: The ventricles and sulci are appropriate size for patient's age. The gray-white matter discrimination is preserved. There is no acute intracranial hemorrhage. No mass effect or midline shift no extra-axial fluid collection Vascular: No  hyperdense vessel or unexpected calcification. Skull: Normal. Negative for fracture or focal lesion. Sinuses/Orbits: No acute finding. Other: Small right posterior parietal scalp contusion or focal thickening/scarring. CT CERVICAL SPINE FINDINGS Alignment: No acute subluxation. Skull base and vertebrae: No acute fracture. Soft tissues and spinal canal: No prevertebral fluid or swelling. No visible canal hematoma. Disc levels: No acute findings. No significant degenerative changes. Upper chest: Negative. Other: None IMPRESSION: 1. Normal noncontrast CT of the brain. 2. No acute/traumatic cervical spine pathology. Electronically Signed   By: Elgie Collard M.D.   On: 04/02/2021 21:33    Procedures Procedures   Medications Ordered in ED Medications  acetaminophen (TYLENOL) 160 MG/5ML solution 650 mg (650 mg Oral Given 04/02/21 2214)  bacitracin ointment ( Topical Given 04/02/21 2213)    ED Course  I have reviewed the triage vital signs and the nursing notes.  Pertinent  labs & imaging results that were available during my care of the patient were reviewed by me and considered in my medical decision making (see chart for details).    MDM Rules/Calculators/A&P                          Laura Liu is a 40 y.o. female who presents with head injury after a vacuum fell on her head. Denies loss of conscious, weakness, vision changes. She was well appearing, with small area of superficial laceration with minimal bleeding and surrounding area of dried blood. Laceration not deep enough for repair.   Head and neck CT normal. Gave Tylenol 650mg . Discharged patient with concussion precautions.   Final Clinical Impression(s) / ED Diagnoses Final diagnoses:  Laceration of scalp, initial encounter    Rx / DC Orders ED Discharge Orders     None        , DO 04/02/21 2327    Tegeler, [id number], MD 04/04/21 0045

## 2021-04-02 NOTE — ED Triage Notes (Signed)
Reports getting hit in the head at the carwash with the cleaning wand.

## 2021-04-02 NOTE — Discharge Instructions (Addendum)
It was a pleasure taking care of you tonight!  You came in for head injury and we did imaging which was normal. You can take over-the-counter Tylenol or ibuprofen for pain.  Please see attached hand out. Recommend taking it easy for the next couple of days and avoiding strenuous activity or anything that could potentially cause further injury to your head. Recommend follow up with your PCP, and returning if you experience vision changes, worsening head pain, or increased weakness.

## 2021-04-13 ENCOUNTER — Encounter: Payer: Medicaid Other | Attending: Physician Assistant | Admitting: Registered"

## 2021-04-13 ENCOUNTER — Other Ambulatory Visit: Payer: Self-pay

## 2021-04-13 ENCOUNTER — Encounter: Payer: Self-pay | Admitting: Registered"

## 2021-04-13 DIAGNOSIS — R634 Abnormal weight loss: Secondary | ICD-10-CM | POA: Insufficient documentation

## 2021-04-13 NOTE — Patient Instructions (Signed)
Instructions/Continued Goals:   Make sure to get in three meals per day. Try to have balanced meals like the My Plate example (see handout). Include lean proteins, vegetables, fruits, and whole grains at meals.  Goal: 3 meals per day and 2-3 snacks  If unable to eat at a meal, have a [employer] drink Continue with 2 [employer] daily.  Add in high calorie dips, oils, sauces, dressings to foods as able. Will order Molli Posey 1.4 samples for you to try.   Continue with multivitamin.   Water: Recommend 48 oz per day. Space at least 30 minutes from meals and may sip with meals.

## 2021-04-13 NOTE — Progress Notes (Signed)
Medical Nutrition Therapy:  Appt start time: 1530 end time:  1600.  Assessment:  Primary concerns today: Pt referred due to abnormal weight loss.   Nutrition Follow Up: Pt present for appointment with son.   Pt reports things going well overall. Reports still getting in [facility] 1.2. Will have a Molli Posey if unable to eat at a mealtime. Reports drinking about 32 oz water or a little more. Pt expected some weight gain today but wt stayed the same. Pt reports her wt always wants to stay the same. Pt reports she tried some of her son's Boost Very High Calorie but it messed up her stomach (cannot tolerance milk based products). Pt is open to trying [employer] 1.4 and completed healthcare release of information form so address can be sent to [employer] rep to mail samples of 1.4 to her to trial.   Weight History: Reports her wt was around 100 lb years ago, highest she has weighed is 110 lb. Pt reports she has always been small. Pt reports she would like to gain weight, reports may be 115 lb or so or a healthy BMI range.    Food Allergies/Intolerances: Reports high sodium foods give her a headache.   GI Concerns: Reports at first had some stomach issues with the [facility] but no longer since getting used to them. Sometimes nausea with acidic or spicy foods. Heartburn with spicy foods. Reports lactose intolerance. Reports having cramping with Boost/Ensure as well. Reports has constipation if she eats cheese. Yogurt tolerated well if eaten in the morning, but not later int day.   Pertinent Lab Values: N/A  Weight Hx: Pt reports highest weight around 110 lb. Reports fluctuating from ~100 lb.   Body Composition Scale Date 04/13/21  Current Body Weight 103.8 lb   Total Body Fat % 11.1  Visceral Fat 2  Fat-Free Mass % 88.8%   Total Body Water % 58.9.0%  Muscle-Mass lbs 30.4 lb  BMI 17.6  Body Fat Displacement          Torso  lbs 7.1         Left Leg  lbs 1.4         Right Leg  lbs 1.4          Left Arm  lbs 0.7        Right Arm   lbs 0.7   Preferred Learning Style:  No preference indicated   Learning Readiness:  Ready  MEDICATIONS: Reports taking multivitamin.    DIETARY INTAKE:  Usual eating pattern includes 2-3 meals and 2-3 snacks per day. Will drink a [employer] if unable to eat a meal. Sometimes does oatmeal and blueberries, toast, yogurt for breakfast. Pt limits red meat, does [place] meat instead. Tries to include plant based foods more often.   Common foods: yogurt, oatmeal.  Avoided foods: hard tacos. Dairy: oatmilk, Activia (every morning)  Typical Snacks: yogurt, blueberries, ice cream/popsicle, cake icing. Pt reports sometimes eats cake icing for snack if she wants something sweet.   Typical Beverages: 2 [employer]ose[employer], 2-3 bottles water.   Location of Meals: separately usually.   Electronics Present at [employer]: Sometimes.   24 Hour Recall:  Breakfast: half to 3/4 [employer] in coffee with honey or brown sugar, 2 pancakes with syrup and butter, 2 [place] sausage (today) Snk: may have had a pickle Lunch: [employer] half personal pan, pomegranate honey tea  Snk: chocolate fudge ice cream pops x 2-3 Dinner: fish  plate: 2 pieces catfish fried, fries, hushpuppies, slaw, Kool Aid Snk: None reported.  Beverages: [employer]osey 2, water, Kool Aid   Usual physical activity: walking to bus stop and swimming at the pool.  Estimated energy needs: (Calucated using IBW of 120 lb):  2230 calories 251-362 g carbohydrates 44 g protein 50-87 g fat  Progress Towards Goal(s):  Some progress.   Nutritional Diagnosis:  NI-5.3 Inadequate protein-energy intake As related to low appetite.  As evidenced by pt's reported dietary recall and habits; BMI of 17.70.    Intervention:  Nutrition counseling provided. Dietitian discussed good to see pt's wt overall maintained from last visit. Discussed trying Molli Posey 1.4 which will provide 155 more kcal per bottle. Pt agreeable.  Encouraged continuing with ongoing recommendations and dietitian will reach out to [facility] rep to see if samples can be mailed to pt. Pt appeared agreeable to information/goals discussed.   Instructions/Continued Goals:   Make sure to get in three meals per day. Try to have balanced meals like the My Plate example (see handout). Include lean proteins, vegetables, fruits, and whole grains at meals.  Goal: 3 meals per day and 2-3 snacks  If unable to eat at a meal, have a [employer] drink Continue with 2 [employer] daily.  Add in high calorie dips, oils, sauces, dressings to foods as able. Will order Molli Posey 1.4 samples for you to try.   Continue with multivitamin.   Water: Recommend 48 oz per day. Space at least 30 minutes from meals and may sip with meals.   Teaching Method Utilized:  Visual Auditory  Barriers to learning/adherence to lifestyle change: None reported.   Demonstrated degree of understanding via:  Teach Back   Monitoring/Evaluation:  Dietary intake, exercise, and body weight in 1 month(s).

## 2021-05-19 ENCOUNTER — Ambulatory Visit: Payer: Medicaid Other | Admitting: Registered"

## 2021-06-01 ENCOUNTER — Ambulatory Visit: Payer: Medicaid Other | Admitting: Registered"

## 2021-07-09 IMAGING — CT CT HEAD W/O CM
3 of 4 series · 16 of 47 positions shown, 19 images · non-contrast
Comparison: None.

CLINICAL DATA: 39-year-old female with head trauma.

EXAM:
CT HEAD WITHOUT CONTRAST
CT CERVICAL SPINE WITHOUT CONTRAST
TECHNIQUE: Multidetector CT imaging of the head and cervical spine was
performed following the standard protocol without intravenous
contrast. Multiplanar CT image reconstructions of the cervical spine
were also generated.

[Series 3: head 2.0 h70h · axial · 0.44mm/px · z∈[+877,+1019]mm · 10 of 79 slices shown, 13 images]
[im 4/79  brain]
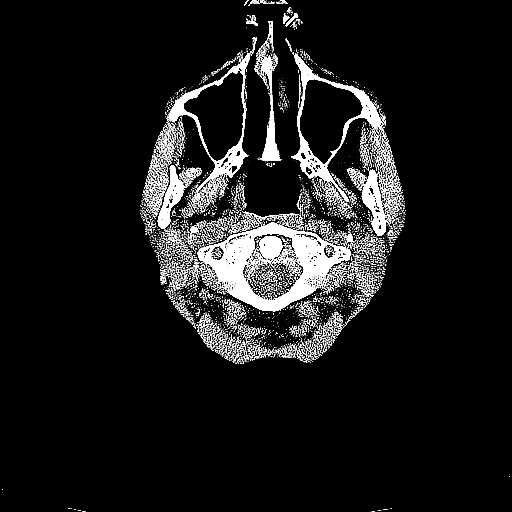
[im 4/79  bone]
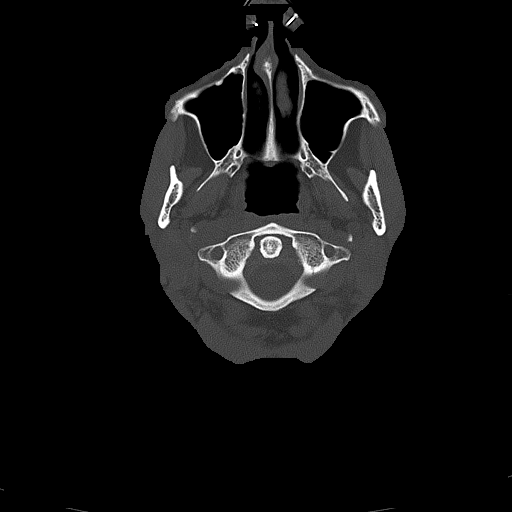
[im 12/79  brain]
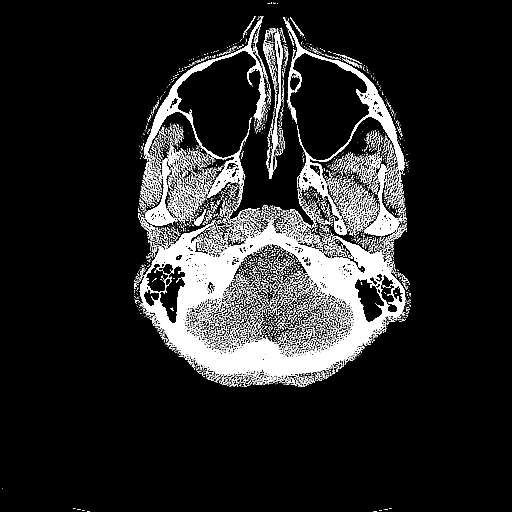
[im 20/79  brain]
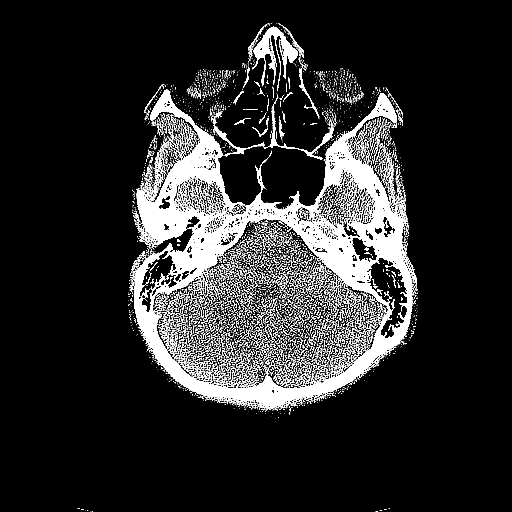
[im 28/79  brain]
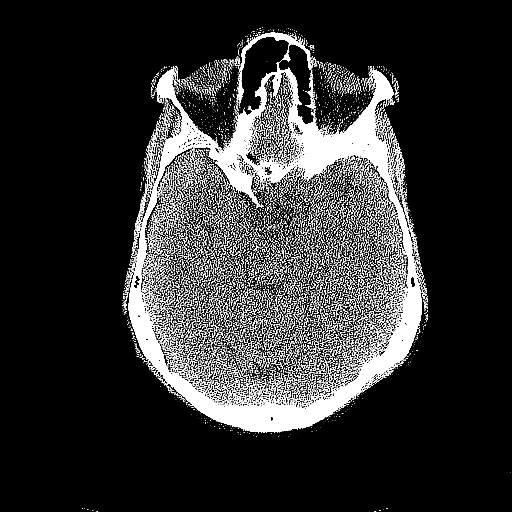
[im 36/79  brain]
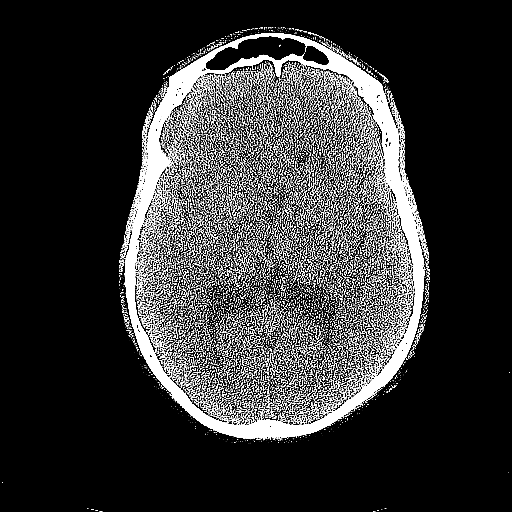
[im 36/79  bone]
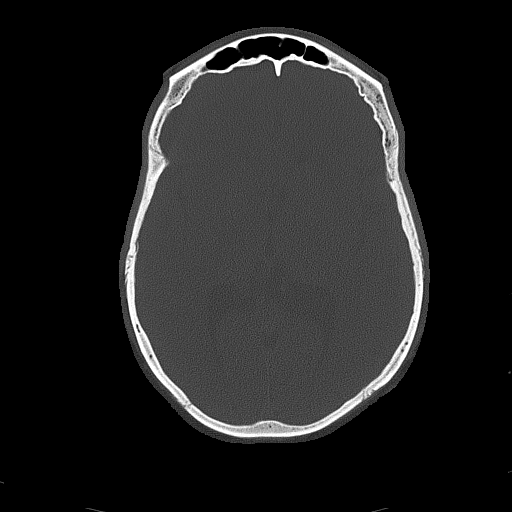
[im 43/79  brain]
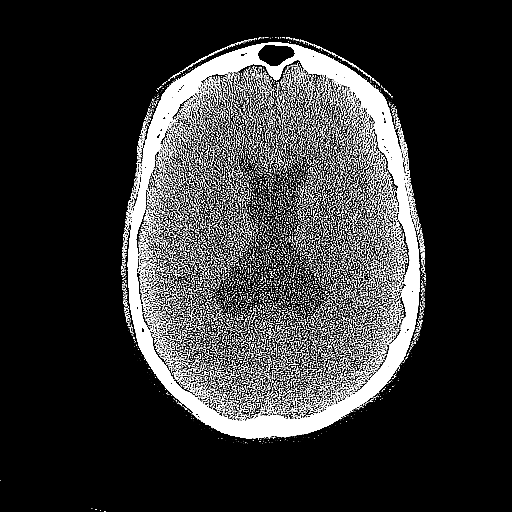
[im 51/79  brain]
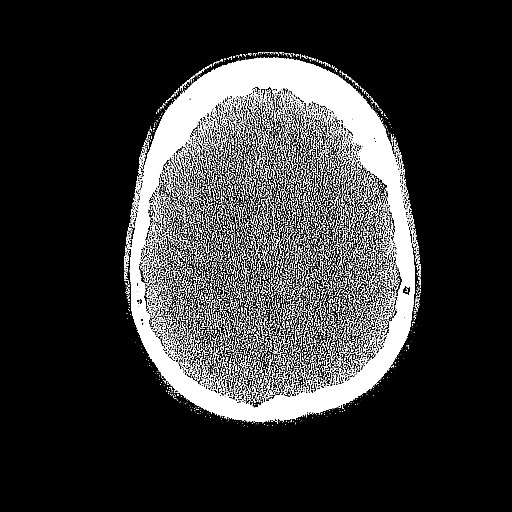
[im 59/79  brain]
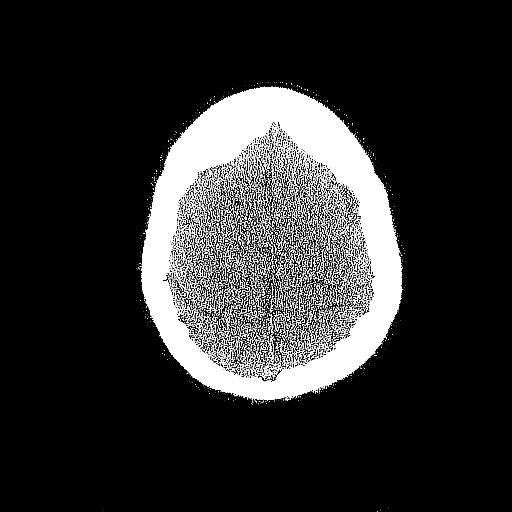
[im 67/79  brain]
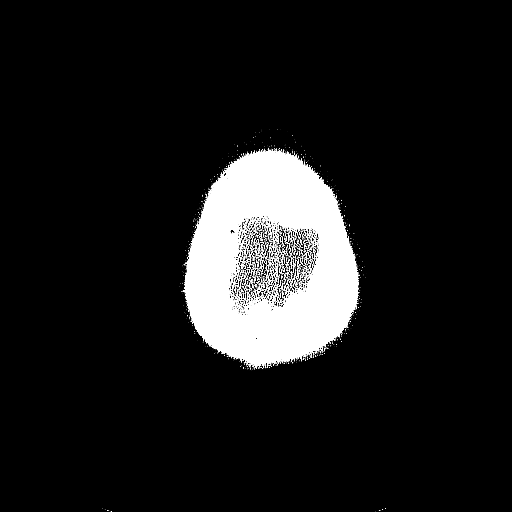
[im 67/79  bone]
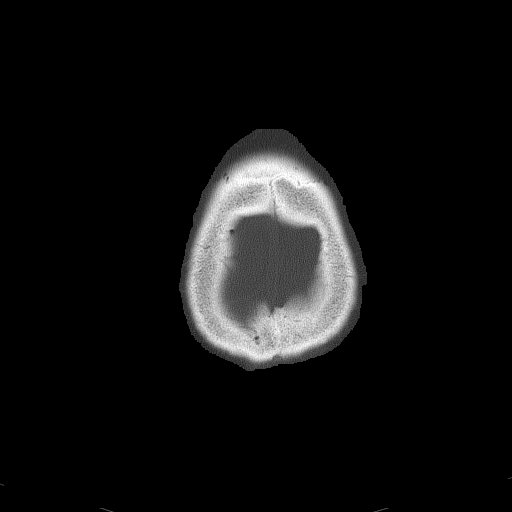
[im 75/79  brain]
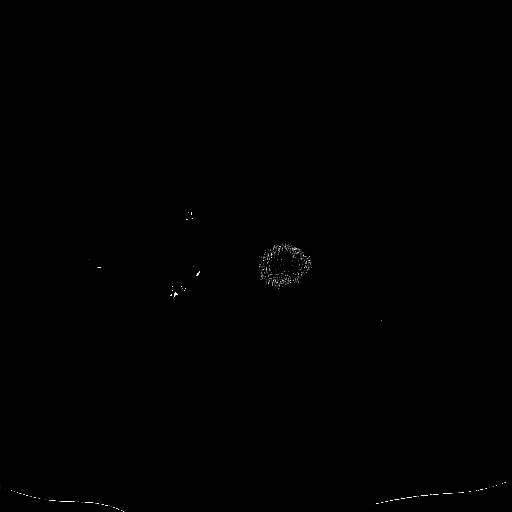

[Series 4: head 3.0 mpr cor · coronal · 0.31mm/px · 3 of 67 slices shown]
[im 23/67  brain]
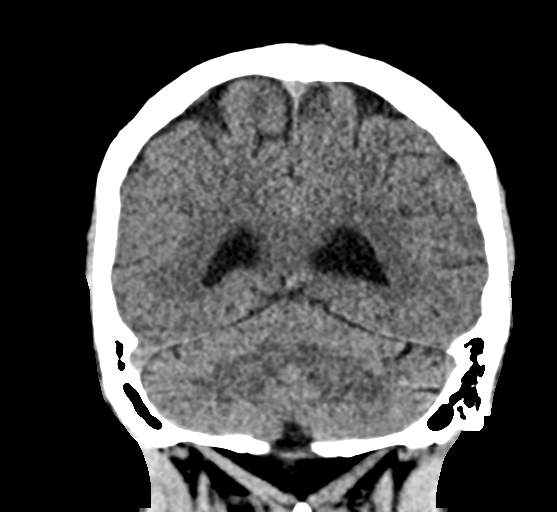
[im 30/67  brain]
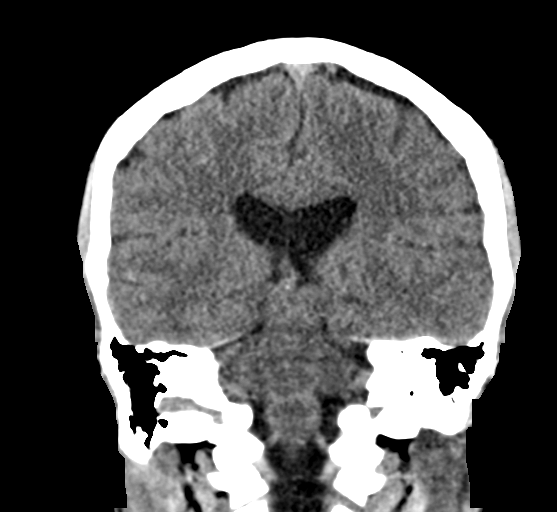
[im 37/67  brain]
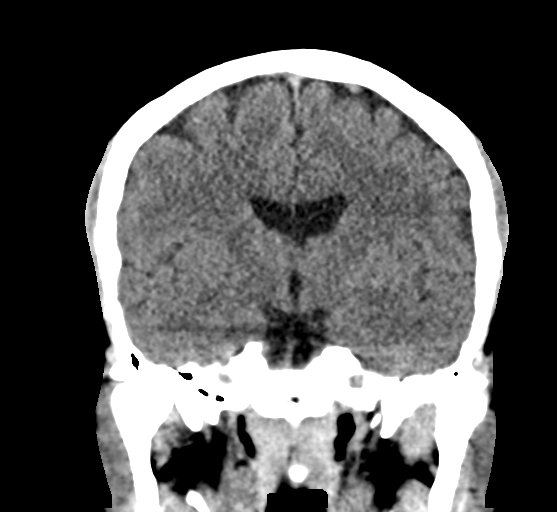

[Series 5: head 3.0 mpr sag · sagittal · 0.36mm/px · 3 of 59 slices shown]
[im 20/59  brain]
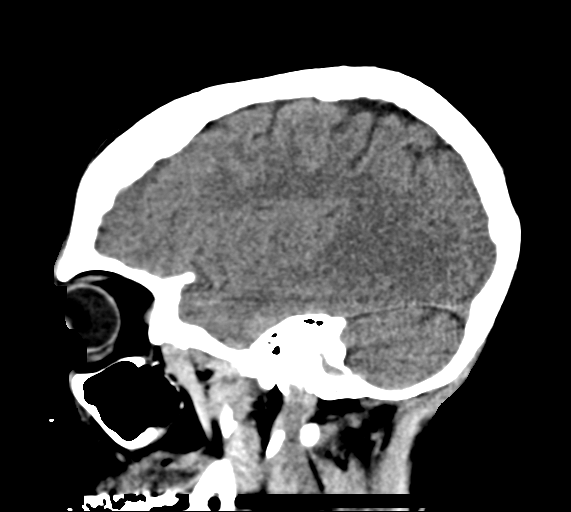
[im 30/59  brain]
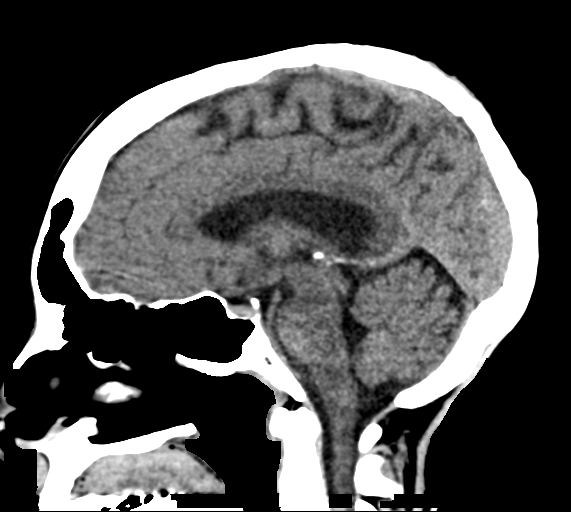
[im 39/59  brain]
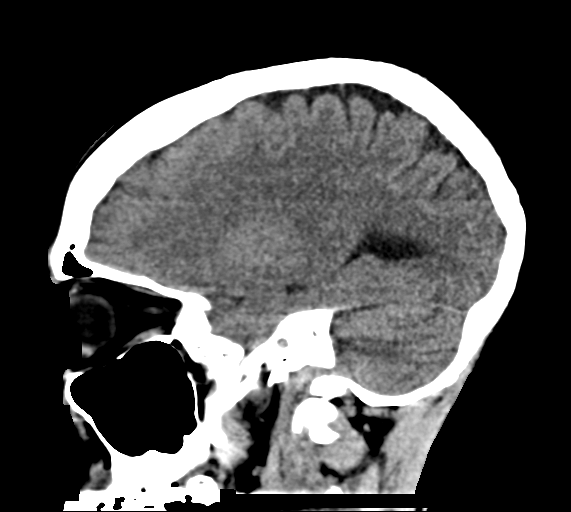

[16 of 47 positions shown; findings below may reference images not displayed]

FINDINGS: CT HEAD FINDINGS

Brain: The ventricles and sulci are appropriate size for patient's
age. The gray-white matter discrimination is preserved. There is no
acute intracranial hemorrhage. No mass effect or midline shift no
extra-axial fluid collection

Vascular: No hyperdense vessel or unexpected calcification.

Skull: Normal. Negative for fracture or focal lesion.

Sinuses/Orbits: No acute finding.

Other: Small right posterior parietal scalp contusion or focal
thickening/scarring.

CT CERVICAL SPINE FINDINGS

Alignment: No acute subluxation.

Skull base and vertebrae: No acute fracture.

Soft tissues and spinal canal: No prevertebral fluid or swelling. No
visible canal hematoma.

Disc levels: No acute findings. No significant degenerative changes.

Upper chest: Negative.

Other: None
IMPRESSION: 1. Normal noncontrast CT of the brain.
2. No acute/traumatic cervical spine pathology.

## 2021-08-03 ENCOUNTER — Other Ambulatory Visit: Payer: Self-pay

## 2021-08-03 ENCOUNTER — Encounter: Payer: Medicaid Other | Attending: Physician Assistant | Admitting: Registered"

## 2021-08-03 DIAGNOSIS — R634 Abnormal weight loss: Secondary | ICD-10-CM | POA: Diagnosis not present

## 2021-08-03 NOTE — Progress Notes (Signed)
Medical Nutrition Therapy:  Appt start time: 1600 end time:  1630.  Assessment:  Primary concerns today: Pt referred due to abnormal weight loss.   Nutrition Follow Up: Pt present for appointment with son.   Pt reports she usually has about 2 [employer] 1.2 per day. Having 3 meals and 2-3 snacks. Reports snacks might be pickle or sandwich, etc. Reports sometimes still will feel nauseous at a meal time. Will drink the [facility] if not feeling like having solid foods. Reports liking bagels with strawberry spread lately. Also eating a lot of eggs.   Reports having some headaches since last visit, reports feeling tiredness in the afternoon but feels due to time change.   Weight History: Reports her wt was around 100 lb years ago, highest she has weighed is 110 lb. Pt reports she has always been small. Pt reports she would like to gain weight, reports may be 115 lb or so or a healthy BMI range.    Food Allergies/Intolerances: Reports high sodium foods give her a headache.   GI Concerns: Some constipation this week-has been increasing prunes to help with it. Reports at first had some stomach issues with the [facility] but no longer since getting used to them. Sometimes nausea with acidic or spicy foods. Heartburn with spicy foods. Reports lactose intolerance. Reports having cramping with Boost/Ensure as well. Reports has constipation if she eats cheese. Yogurt tolerated well if eaten in the morning, but not later int day.   Pertinent Lab Values: N/A  Weight Hx: Pt reports highest weight around 110 lb. Reports fluctuating from ~100 lb.   Preferred Learning Style:  No preference indicated   Learning Readiness:  Ready  MEDICATIONS: Reports taking multivitamin.    DIETARY INTAKE:  Usual eating pattern includes 2-3 meals and 2-3 snacks per day. Will drink a Molli Posey if unable to eat a meal. Sometimes does oatmeal and blueberries, toast, yogurt for breakfast. Pt limits red meat, does [place] meat  instead. Tries to include plant based foods more often.   Common foods: yogurt, oatmeal.  Avoided foods: hard tacos. Dairy: oatmilk, Activia (every morning)  Typical Snacks: yogurt, blueberries, ice cream/popsicle, cake icing. Pt reports sometimes eats cake icing for snack if she wants something sweet.   Typical Beverages: 2 Molli Posey, 2-3 bottles water.   Location of Meals: separately usually.   Electronics Present at [employer]: Sometimes.   24 Hour Recall:  Breakfast: 2 eggs and vegan cheese, french toast strips x 3-4, oatmilk OR orange juice OR coffee + Molli Posey, water  Snack: None reported.  Lunch: [place] sandwich with mayo, tomato, cheese, orange juice  Snack: raisin swirl bread with cream cheese x 2 slices toasted, oatmilk  Dinner: homemade 2 cheeseburgers with bun, lettuce, tomato, mayo, ketchup, mustard, soda  Snack: None reported.  Beverages: Molli Posey x 2, water, soda, orange juice  Usual physical activity: walking to bus stop and swimming at the pool.  Estimated energy needs: (Calucated using IBW of 120 lb):  2230 calories 251-362 g carbohydrates 44 g protein 50-87 g fat  Progress Towards Goal(s):  Some progress.   Nutritional Diagnosis:  NI-5.3 Inadequate protein-energy intake As related to low appetite.  As evidenced by pt's reported dietary recall and habits; BMI of 17.70.    Intervention:  Nutrition counseling provided. Wt today is up ~1 lb since last visit. Praised pt for continuing with eating routine and encouraged continuing with recommendations. Pt appeared agreeable to information/goals discussed.   Instructions/Continued  Goals:   Make sure to get in three meals per day. Try to have balanced meals like the My Plate example (see handout). Include lean proteins, vegetables, fruits, and whole grains at meals.  Goal: 3 meals per day and 2-3 snacks  If unable to eat at a meal, have a [employer] drink Continue with 2-3 [employer] daily. May try having 3 if  able. Add in high calorie dips, oils, sauces, dressings to foods as able.  Continue with multivitamin.   Water: Recommend 48 oz per day. Space at least 30 minutes from meals and may sip with meals.   Teaching Method Utilized:  Visual Auditory  Barriers to learning/adherence to lifestyle change: None reported.   Demonstrated degree of understanding via:  Teach Back   Monitoring/Evaluation:  Dietary intake, exercise, and body weight in 2 month(s).

## 2021-08-03 NOTE — Patient Instructions (Addendum)
Instructions/Continued Goals:   Make sure to get in three meals per day. Try to have balanced meals like the My Plate example (see handout). Include lean proteins, vegetables, fruits, and whole grains at meals.  Goal: 3 meals per day and 2-3 snacks  If unable to eat at a meal, have a [employer] drink Continue with 2-3 [employer] daily. May try having 3 if able. Add in high calorie dips, oils, sauces, dressings to foods as able.  Continue with multivitamin.   Water: Recommend 48 oz per day. Space at least 30 minutes from meals and may sip with meals.

## 2021-08-09 ENCOUNTER — Encounter: Payer: Self-pay | Admitting: Registered"

## 2021-10-01 ENCOUNTER — Encounter (HOSPITAL_BASED_OUTPATIENT_CLINIC_OR_DEPARTMENT_OTHER): Payer: Self-pay | Admitting: Emergency Medicine

## 2021-10-01 ENCOUNTER — Other Ambulatory Visit: Payer: Self-pay

## 2021-10-01 ENCOUNTER — Emergency Department (HOSPITAL_BASED_OUTPATIENT_CLINIC_OR_DEPARTMENT_OTHER)
Admission: EM | Admit: 2021-10-01 | Discharge: 2021-10-02 | Disposition: A | Discharge status: Home / Self Care | Payer: Medicaid Other | Attending: Emergency Medicine | Admitting: Emergency Medicine

## 2021-10-01 DIAGNOSIS — R590 Localized enlarged lymph nodes: Secondary | ICD-10-CM | POA: Insufficient documentation

## 2021-10-01 DIAGNOSIS — R220 Localized swelling, mass and lump, head: Secondary | ICD-10-CM | POA: Diagnosis present

## 2021-10-01 DIAGNOSIS — L03211 Cellulitis of face: Secondary | ICD-10-CM | POA: Diagnosis not present

## 2021-10-01 MED ORDER — CEPHALEXIN 250 MG PO CAPS
500.0000 mg | ORAL_CAPSULE | Freq: Once | ORAL | Status: AC
  Administered 2021-10-01: 500 mg via ORAL
  Filled 2021-10-01: qty 2

## 2021-10-01 MED ORDER — CEPHALEXIN 500 MG PO CAPS: 500.0000 mg | ORAL_CAPSULE | Freq: Four times a day (QID) | ORAL | 0 refills | Status: DC

## 2021-10-01 MED ORDER — MUPIROCIN 2 % EX OINT: TOPICAL_OINTMENT | Freq: Two times a day (BID) | CUTANEOUS | 0 refills | Status: AC

## 2021-10-01 NOTE — ED Triage Notes (Signed)
Reports nares are red and painful for the last few days.  Has dermatology appt on Monday.  Piercings are not new.

## 2021-10-01 NOTE — ED Provider Notes (Signed)
[facility] EMERGENCY DEPARTMENT Provider Note   CSN: [id number] Arrival date & time: 10/01/21  2317     History  No chief complaint on file.   Laura Liu is a 41 y.o. female.  HPI  41 year old female presenting to the emergency department with a red and painful right nare for the past 4 days.  She states that she had nasal piercings in place and developed pain, redness, swelling around her right nare and nasolabial fold.  She denies any cheek pain/periorbital pain or pain with extraocular movements.  She denies any fevers or chills.  She does endorse a swollen lymph node on the right of her neck.  No other complaints at this time.  She did have a dermatology appointment for Monday scheduled for evaluation for this.  Home Medications Prior to Admission medications   Medication Sig Start Date End Date Taking? Authorizing Provider  cephALEXin (KEFLEX) 500 MG capsule Take 1 capsule (500 mg total) by mouth 4 (four) times daily. 10/01/21  Yes Ernie Avena, MD  mupirocin ointment (BACTROBAN) 2 % Apply topically 2 (two) times daily. Apply to right nare 10/01/21  Yes Ernie Avena, MD  norethindrone-ethinyl estradiol (LOESTRIN) 1-20 MG-MCG tablet Take 1 tablet by mouth daily. 12/25/19   [provider]  ondansetron (ZOFRAN ODT) 4 MG disintegrating tablet Take 1 tablet (4 mg total) by mouth every 8 (eight) hours as needed for nausea or vomiting. Patient not taking: Reported on 01/19/2021 04/01/20   Tanda Rockers, PA-C  VITAMIN D PO Take by mouth.    [provider]      Allergies    Patient has no known allergies.    Review of Systems   Review of Systems  Physical Exam Updated Vital Signs BP (!) 173/99 (BP Location: Right Arm)    Pulse 90    Temp 98.4 F (36.9 C) (Oral)    Resp 18    Ht 5\' 4"  (1.626 m)    Wt 48.1 kg    SpO2 98%    BMI 18.19 kg/m  Physical Exam Vitals and nursing note reviewed.  Constitutional:      General: She is not in acute  distress. HENT:     Head: Normocephalic and atraumatic.     Nose: Nasal tenderness present.     Right Nostril: No foreign body, epistaxis or septal hematoma.     Left Nostril: No foreign body, epistaxis or septal hematoma.     Comments: Mild tenderness palpation about the right nasolabial fold with mild erythema present, no fluctuance    Mouth/Throat:     Mouth: Mucous membranes are moist.  Eyes:     Extraocular Movements: Extraocular movements intact.     Conjunctiva/sclera: Conjunctivae normal.     Pupils: Pupils are equal, round, and reactive to light.     Comments: No pain with extraocular movements, no periorbital swelling or tenderness  Cardiovascular:     Rate and Rhythm: Normal rate and regular rhythm.  Pulmonary:     Effort: Pulmonary effort is normal. No respiratory distress.  Abdominal:     General: There is no distension.     Tenderness: There is no guarding.  Musculoskeletal:        General: No deformity or signs of injury.     Cervical back: Neck supple.  Lymphadenopathy:     Cervical: Cervical adenopathy present.     Right cervical: Superficial cervical adenopathy present.  Skin:    Findings: No lesion or rash.  Neurological:  General: No focal deficit present.     Mental Status: She is alert. Mental status is at baseline.    ED Results / Procedures / Treatments   Labs (all labs ordered are listed, but only abnormal results are displayed) Labs Reviewed - No data to display  EKG None  Radiology No results found.  Procedures Procedures    Medications Ordered in ED Medications  cephALEXin (KEFLEX) capsule 500 mg (500 mg Oral Given 10/01/21 2346)    ED Course/ Medical Decision Making/ A&P                           Medical Decision Making  41 year old female presenting to the emergency department with a red and painful right nare for the past 4 days.  She states that she had nasal piercings in place and developed pain, redness, swelling around her  right nare and nasolabial fold.  She denies any cheek pain/periorbital pain or pain with extraocular movements.  She denies any fevers or chills.  She does endorse a swollen lymph node on the right of her neck.  No other complaints at this time.  She did have a dermatology appointment for Monday scheduled for evaluation for this.  On arrival, the patient was afebrile, hemodynamically stable, mildly hypertensive with a BP of 173/99, saturating well on room air.  Physical exam significant for mild periorbital tenderness and erythema.  No evidence for periorbital cellulitis or orbital cellulitis at this time with no pain with extraocular movements and no evidence of cellulitis periorbitally.  Symptoms are concerning for developing cellulitis around the nasolabial fold, likely as a result of the patient's nasal piercing.  She has since removed them.  Mild tender right-sided cervical lymphadenopathy present likely reactive inflammatory changes in response to the patient's infection versus lymphadenitis.  Will trial a course of Bactroban ointment and Keflex and have the patient follow-up outpatient.  Advised Tylenol and ibuprofen for pain control.  Overall stable for discharge at this time.   Final Clinical Impression(s) / ED Diagnoses Final diagnoses:  Cellulitis of face    Rx / DC Orders ED Discharge Orders          Ordered    cephALEXin (KEFLEX) 500 MG capsule  4 times daily        10/01/21 2340    mupirocin ointment (BACTROBAN) 2 %  2 times daily        10/01/21 2341              Ernie Avena, MD 10/01/21 2349

## 2021-10-01 NOTE — Discharge Instructions (Addendum)
You were evaluated in the Emergency Department and after careful evaluation, we did not find any emergent condition requiring admission or further testing in the hospital.  Your exam/testing today was overall reassuring. You appear to have reactive/inflammatory changes around your right nare concerning for developing cellulitis/infection. We will treat with a course of ABX and have you follow-up in a few days for a recheck to ensure improvement. Try an antibiotic ointment as well.  Please return to the Emergency Department if you experience any worsening of your condition.  Thank you for allowing [place] to be a part of your care.

## 2021-10-12 ENCOUNTER — Encounter: Payer: Medicaid Other | Attending: Physician Assistant | Admitting: Registered"

## 2021-10-12 ENCOUNTER — Other Ambulatory Visit: Payer: Self-pay

## 2021-10-12 DIAGNOSIS — R634 Abnormal weight loss: Secondary | ICD-10-CM | POA: Diagnosis present

## 2021-10-12 NOTE — Patient Instructions (Signed)
Instructions/Continued Goals:   Make sure to get in three meals per day. Try to have balanced meals like the My Plate example (see handout). Include lean proteins, vegetables, fruits, and whole grains at meals.  Goal: 3 meals per day and 2-3 snacks  If unable to eat at a meal, have a [employer] drink Continue with 2-3 [employer] daily. May try having 3 if able. Add in high calorie dips, oils, sauces, dressings to foods as able.  Reflux:  Recommend avoiding acidic foods (tomato sauce, orange juice, lemonade), spicy foods and peppermint/spearmint Recommend being conscious about your decaf coffee and chocolate. Try not having the decaf coffee 1-2 mornings and see how you feel/if nausea improves  Avoid laying down within 3 hours of eating  Prop up in bed when sleeping   Continue with multivitamin.   Water: Recommend 48 oz per day. Space at least 30 minutes from meals and may sip with meals.

## 2021-10-12 NOTE — Progress Notes (Signed)
Medical Nutrition Therapy:  Appt start time: 1130 end time:  1200.  Assessment:  Primary concerns today: Pt referred due to abnormal weight loss.   Nutrition Follow Up: Pt present for appointment with son.   Pt reports things are going about the same. Reports trying to eat the same way as before but has been having some more nausea lately which sometimes makes her not want to eat or even drink. Pt plans to talk with PCP about getting back on antacid, reports having reflux in the past as well. Pt drinks decaf coffee in the morning and wants to know if decaf can still cause reflux.   Weight History: Reports her wt was around 100 lb years ago, highest she has weighed is 110 lb. Pt reports she has always been small. Pt reports she would like to gain weight, reports may be 115 lb or so or a healthy BMI range.    Food Allergies/Intolerances: Reports high sodium foods give her a headache.   GI Concerns: Some constipation this week-has been increasing prunes to help with it. Reports at first had some stomach issues with the [facility] but no longer since getting used to them. Sometimes nausea with acidic or spicy foods. Heartburn with spicy foods. Reports lactose intolerance. Reports having cramping with Boost/Ensure as well. Reports has constipation if she eats cheese. Yogurt tolerated well if eaten in the morning, but not later int day.   Pertinent Lab Values: N/A  Weight Hx: Pt reports highest weight around 110 lb. Reports fluctuating from ~100 lb.   Preferred Learning Style:  No preference indicated   Learning Readiness:  Ready  MEDICATIONS: Reports taking multivitamin and vitamin D.    DIETARY INTAKE:  Usual eating pattern includes 2-3 meals and 2-3 snacks per day. Will drink a Molli Posey if unable to eat a meal but lately when nauseous sometimes doesn't feel like having [employer] either. Sometimes does oatmeal and blueberries, toast, yogurt for breakfast. Pt limits red meat, does [place]  meat instead. Tries to include plant based foods more often.   Common foods: yogurt, oatmeal.  Avoided foods: hard tacos. Dairy: oatmilk, Activia (every morning)  Typical Snacks: yogurt, blueberries, ice cream/popsicle, cake icing. Pt reports sometimes eats cake icing for snack if she wants something sweet.   Typical Beverages: 2 Molli Posey, 2-3 bottles water, decaf coffee.   Location of Meals: separately usually.   Electronics Present at [employer]: Sometimes.   24 Hour Recall:  Breakfast: Activia yogurt, oatmeal with blueberries and honey, decaff coffee and [employer]  Snack:  Lunch: peanut butter and jelly sandwich, oatmilk  Snack: cookies x 3, oatmilk  Dinner: can raviola and noodles, cheese and ranch Snack:  Beverages: 2 bottles [employer], [place], water   Usual physical activity: walking to bus stop and swimming at the pool.  Estimated energy needs: (Calucated using IBW of 120 lb):  2230 calories 251-362 g carbohydrates 44 g protein 50-87 g fat  Progress Towards Goal(s):  Some progress.   Nutritional Diagnosis:  NI-5.3 Inadequate protein-energy intake As related to low appetite.  As evidenced by pt's reported dietary recall and habits; BMI of 17.70.    Intervention:  Nutrition counseling provided. Wt today about 1 lb down from last visit. Discussed foods that often exacerbate GERD and made plan for working to reduce reflux. Also recommend pt talk with her doctor about it as well. Discussed continuing with previous recommendations for meals/snacks and high calorie nutrition. Pt appeared agreeable to information/goals  discussed.   Instructions/Continued Goals:   Make sure to get in three meals per day. Try to have balanced meals like the My Plate example (see handout). Include lean proteins, vegetables, fruits, and whole grains at meals.  Goal: 3 meals per day and 2-3 snacks  If unable to eat at a meal, have a [employer] drink Continue with 2-3 [employer] daily. May try  having 3 if able. Add in high calorie dips, oils, sauces, dressings to foods as able.  Reflux:  Recommend avoiding acidic foods (tomato sauce, orange juice, lemonade), spicy foods and peppermint/spearmint Recommend being conscious about your decaf coffee and chocolate. Try not having the decaf coffee 1-2 mornings and see how you feel/if nausea improves  Avoid laying down within 3 hours of eating  Prop up in bed when sleeping   Continue with multivitamin.   Water: Recommend 48 oz per day. Space at least 30 minutes from meals and may sip with meals.   Teaching Method Utilized:  Visual Auditory  Barriers to learning/adherence to lifestyle change: None reported.   Demonstrated degree of understanding via:  Teach Back   Monitoring/Evaluation:  Dietary intake, exercise, and body weight in 2 month(s).

## 2021-10-20 ENCOUNTER — Encounter: Payer: Self-pay | Admitting: Registered"

## 2021-11-25 ENCOUNTER — Ambulatory Visit: Payer: Medicaid Other | Admitting: Registered"

## 2021-12-09 ENCOUNTER — Other Ambulatory Visit: Payer: Self-pay

## 2021-12-09 ENCOUNTER — Encounter: Payer: Medicaid Other | Attending: Physician Assistant | Admitting: Registered"

## 2021-12-09 ENCOUNTER — Encounter: Payer: Self-pay | Admitting: Registered"

## 2021-12-09 DIAGNOSIS — R634 Abnormal weight loss: Secondary | ICD-10-CM | POA: Insufficient documentation

## 2021-12-09 NOTE — Progress Notes (Signed)
Medical Nutrition Therapy:  Appt start time: 1030 end time:  1100. ? ?Assessment:  Primary concerns today: Pt referred due to abnormal weight loss.  ? ?Nutrition Follow Up: Pt present for appointment with son.  ? ?Pt reports she started taking an acid reflux medication which has helped with reflux and nausea. Also takes Tums as needed. No longer feeling nauseous. In mornings, if not feeling very hungry will try to at least do [employer] drink and oatmeal. Reports no longer drinking coffee due to reflux but will mixed 1 Molli Posey with brown sugar and water. Pt has had improvements in acid symptoms since avoiding coffee. Pt reports she does not feel like she can drink more than 1 [employer] per day. Pt is agreeable with trying some Duocal to boost calories.  ? ?Pt reports sometimes food gets low and she doesn't like the foods they have left (Beenie Weenies, Ramen Noodles, etc) as much but will still eat them. Reports they used to get food assistance from a local church to supplement their SNAP but have not been food from the church recently. Reports someone would reach out about it but have not longer been contacting them. Mother does not report having any other information on local food resources.  ? ?Weight History: Reports her wt was around 100 lb years ago, highest she has weighed is 110 lb. Pt reports she has always been small. Pt reports she would like to gain weight, reports may be 115 lb or so or a healthy BMI range.   ? ?Food Allergies/Intolerances: Reports high sodium foods give her a headache.  ? ?GI Concerns: None reported.  ? ?Pertinent Lab Values: N/A ? ?Weight Hx: Pt reports highest weight around 110 lb. Reports fluctuating from ~100 lb.  ? ?Preferred Learning Style:  ?No preference indicated  ? ?Learning Readiness:  ?Ready ? ?MEDICATIONS: See list. Reviewed. Supplements: None reported.  ?  ?DIETARY INTAKE: ? ?Usual eating pattern includes 3 meals and 2-3 snacks per day. Pt limits red meat, does [place]  meat instead. Tries to include plant based foods more often.  ? ?Common foods: yogurt, oatmeal.  Avoided foods: hard tacos. Dairy: oatmilk, Activia drinkable yogurt, [employer].   ? ?Typical Snacks: yogurt, blueberries, ice cream/popsicle, Fudge Rounds.  ? ?Typical Beverages: 1 [employer], 2-3 bottles water, decaf coffee.  ? ?Location of Meals: separately usually.  ? ?Electronics Present at [employer]: Sometimes.  ? ?24 Hour Recall:  ?Breakfast: avocado, lunchmeat, eggs, Molli Posey with brown sugar and water  ?Snack: Goldfish  ?Lunch: sandwich with [place], cheese, mayo, mustard  ?Snack: gummy worms, marshmallow peeps ?Dinner: [employer], water ?Snack: None reported.  ?Beverages: 1 [employer] Posey, water ? ?Usual physical activity: walking to bus stop.  ? ?Estimated energy needs: (Calucated using IBW of 120 lb):  ?2230 calories ?251-362 g carbohydrates ?44 g protein ?50-87 g fat ? ?Progress Towards Goal(s):  Some progress. ?  ?Nutritional Diagnosis:  ?NI-5.3 Inadequate protein-energy intake As related to low appetite.  As evidenced by pt's reported dietary recall and habits; BMI of 17.70. ?   ?Intervention:  Nutrition counseling provided. Wt today about same as at last visit. Discussed continuing to ensure getting 3 meals and try for a snack in between and continuing with [employer] daily. Discussed pt may try Duocal powder in foods (mixing instructions given) to help meet calorie needs and help reach wt goal. If it works well may add it/equivalent to pt's DME order. Provided information on local food  pantries in [facility] for pt to help with food insecurity reported. Pt appeared agreeable to information/goals discussed.  ? ?Instructions/Continued Goals:  ? ?Make sure to get in three meals per day. Try to have balanced meals like the My Plate example (see handout). Include lean proteins, vegetables, fruits, and whole grains at meals.  ?Goal: 3 meals per day and 2-3 snacks  ?If unable to eat at a meal, have a [employer]  drink ?Continue with 1 [employer] daily.  ?Add in high calorie dips, Duocal, oils, sauces, dressings to foods as able. ?See handout for Duocal directions. Can add to your order along with your Molli Posey if that works well for you.  ? ?Reflux: Continue:  ?Recommend avoiding acidic foods (tomato sauce, orange juice, lemonade), spicy foods and peppermint/spearmint ?Recommend being conscious about your decaf coffee and chocolate. Try not having the decaf coffee 1-2 mornings and see how you feel/if nausea improves  ?Avoid laying down within 3 hours of eating  ?Prop up in bed when sleeping  ? ?Recommend multivitamin.  ? ?Food Assistance:  ? ?[facility] [place] ?[employer] ?Call us at [phone] Ext. #1. We will schedule a time for you to come by and apply for services. Emergency food is available while you wait for your application to process. No documentation is required for food assistance. ? ?[employer] ?[address], [place], [place], [address] ?[phone] ?2nd Tuesday of each month: 7:30p - 9p ? ?List of some additional food panties: https://www.freefood.org/c/[place] ? ?Teaching Method Utilized:  ?Visual ?Auditory ? ?Barriers to learning/adherence to lifestyle change: None reported.  ? ?Demonstrated degree of understanding via:  Teach Back  ? ?Monitoring/Evaluation:  Dietary intake, exercise, and body weight in 6 week(s).  ?

## 2021-12-09 NOTE — Patient Instructions (Addendum)
Instructions/Continued Goals:  ? ?Make sure to get in three meals per day. Try to have balanced meals like the My Plate example (see handout). Include lean proteins, vegetables, fruits, and whole grains at meals.  ?Goal: 3 meals per day and 2-3 snacks  ?If unable to eat at a meal, have a [employer] drink ?Continue with 1 [employer] daily.  ?Add in high calorie dips, Duocal, oils, sauces, dressings to foods as able. ?See handout for Duocal directions. Can add to your order along with your Molli Posey if that works well for you.  ? ?Reflux: Continue:  ?Recommend avoiding acidic foods (tomato sauce, orange juice, lemonade), spicy foods and peppermint/spearmint ?Recommend being conscious about your decaf coffee and chocolate. Try not having the decaf coffee 1-2 mornings and see how you feel/if nausea improves  ?Avoid laying down within 3 hours of eating  ?Prop up in bed when sleeping  ? ?Recommend multivitamin.  ? ?Food Assistance:  ? ?[facility] [place] ?[employer] ?Call us at [phone] Ext. #1. We will schedule a time for you to come by and apply for services. Emergency food is available while you wait for your application to process. No documentation is required for food assistance. ? ?[employer] ?[address] [place], [place], [address] ?[phone] ?2nd Tuesday of each month: 7:30p - 9p ? ?List of some additional food panties: https://www.freefood.org/c/[place] ? ? ?

## 2021-12-12 ENCOUNTER — Other Ambulatory Visit: Payer: Self-pay

## 2021-12-12 ENCOUNTER — Emergency Department (HOSPITAL_BASED_OUTPATIENT_CLINIC_OR_DEPARTMENT_OTHER)
Admission: EM | Admit: 2021-12-12 | Discharge: 2021-12-13 | Disposition: A | Discharge status: Home / Self Care | Payer: Medicaid Other | Attending: Emergency Medicine | Admitting: Emergency Medicine

## 2021-12-12 DIAGNOSIS — U071 COVID-19: Secondary | ICD-10-CM | POA: Diagnosis not present

## 2021-12-12 DIAGNOSIS — R509 Fever, unspecified: Secondary | ICD-10-CM | POA: Diagnosis present

## 2021-12-12 MED ORDER — ACETAMINOPHEN 325 MG PO TABS
650.0000 mg | ORAL_TABLET | Freq: Once | ORAL | Status: AC
  Administered 2021-12-12: 650 mg via ORAL
  Filled 2021-12-12: qty 2

## 2021-12-12 NOTE — ED Triage Notes (Signed)
Fever, cough body aches today.  ?

## 2021-12-12 NOTE — ED Provider Notes (Signed)
? ?[facility] ?Provider Note: [name], MD, FACEP ? ?CSN: [id number] ?MRN: [id number] ?ARRIVAL: 12/12/21 at 2235 ?ROOM: [facility] ? ? ?CHIEF COMPLAINT  ?URI ? ? ?HISTORY OF PRESENT ILLNESS  ?12/12/21 11:44 PM ?Laura Liu is a 41 y.o. female with fever, cough, nasal congestion, postnasal drip, mild headache, and body aches today.  On arrival her temperature was 101.3 and she was given 650 mg of acetaminophen.  She rates her body aches as a 3 out of 10.  She denies shortness of breath or GI symptoms. ? ? ?Past Medical History:  ?Diagnosis Date  ? Low iron   ? ? ?Past Surgical History:  ?Procedure Laterality Date  ? BUNIONECTOMY    ? ? ?Family History  ?Problem Relation Age of Onset  ? Hypertension Mother   ? Cancer Other   ? ? ?Social History  ? ?Tobacco Use  ? Smoking status: Never  ? Smokeless tobacco: Never  ?Vaping Use  ? Vaping Use: Never used  ?Substance Use Topics  ? Alcohol use: No  ?  Alcohol/week: 0.0 standard drinks  ? Drug use: No  ? ? ?Prior to Admission medications   ?Medication Sig Start Date End Date Taking? Authorizing Provider  ?norethindrone-ethinyl estradiol (LOESTRIN) 1-20 MG-MCG tablet Take 1 tablet by mouth daily. 12/25/19  Yes [provider]  ?mupirocin ointment (BACTROBAN) 2 % Apply topically 2 (two) times daily. Apply to right nare 10/01/21   Ernie Avena, MD  ?VITAMIN D PO Take by mouth.    [provider]  ? ? ?Allergies ?Patient has no known allergies. ? ? ?REVIEW OF SYSTEMS  ?Negative except as noted here or in the History of Present Illness. ? ? ?PHYSICAL EXAMINATION  ?Initial Vital Signs ?Blood pressure (!) 155/94, pulse (!) 107, temperature (!) 101.3 ?F (38.5 ?C), temperature source Oral, resp. rate 20, height 5\' 4"  (1.626 m), weight 46.7 kg, SpO2 98 %. ? ?Examination ?General: Well-developed, thin female in no acute distress; appearance consistent with age of record ?HENT: normocephalic; atraumatic; no pharyngeal erythema or exudate ?Eyes: pupils  equal, round and reactive to light; extraocular muscles intact ?Neck: supple ?Heart: regular rate and rhythm; tachycardia; holosystolic murmur loudest at right upper sternal border ?Lungs: clear to auscultation bilaterally ?Abdomen: soft; nondistended; nontender; bowel sounds present ?Extremities: No deformity; full range of motion; pulses normal ?Neurologic: Awake, alert and oriented; motor function intact in all extremities and symmetric; no facial droop ?Skin: Warm and dry ?Psychiatric: Normal mood and affect ? ? ?RESULTS  ?Summary of this visit's results, reviewed and interpreted by myself: ? ? EKG Interpretation ? ?Date/Time:    ?Ventricular Rate:    ?PR Interval:    ?QRS Duration:   ?QT Interval:    ?QTC Calculation:   ?R Axis:     ?Text Interpretation:   ?  ? ?  ? ?Laboratory Studies: ?Results for orders placed or performed during the hospital encounter of 12/12/21 (from the past 24 hour(s))  ?Resp Panel by RT-PCR (Flu A&B, Covid) Nasopharyngeal Swab     Status: Abnormal  ? Collection Time: 12/12/21 10:55 PM  ? Specimen: Nasopharyngeal Swab; Nasopharyngeal(NP) swabs in vial transport medium  ?Result Value Ref Range  ? SARS Coronavirus 2 by RT PCR POSITIVE (A) NEGATIVE  ? Influenza A by PCR NEGATIVE NEGATIVE  ? Influenza B by PCR NEGATIVE NEGATIVE  ? ?Imaging Studies: ?No results found. ? ?ED COURSE and MDM  ?Nursing notes, initial and subsequent vitals signs, including pulse oximetry, reviewed and interpreted  by myself. ? ?Vitals:  ? 12/12/21 2250 12/12/21 2252  ?BP: (!) 155/94   ?Pulse: (!) 107   ?Resp: 20   ?Temp: (!) 101.3 ?F (38.5 ?C)   ?TempSrc: Oral   ?SpO2: 98%   ?Weight:  46.7 kg  ?Height:  5\' 4"  (1.626 m)  ? ?Medications  ?acetaminophen (TYLENOL) tablet 650 mg (650 mg Oral Given 12/12/21 2259)  ? ?Patient positive for COVID-19.  She is not in any risk groups that would benefit from Paxlovid or molnupiravir.  Her symptoms are not severe and she is having no difficulty breathing. ? ? ?PROCEDURES   ?Procedures ? ? ?ED DIAGNOSES  ? ?  ICD-10-CM   ?1. COVID-19 virus infection  U07.1   ?  ? ? ? ?  ?2260, MD ?12/13/21 12/15/21 ? ?

## 2021-12-13 LAB — RESP PANEL BY RT-PCR (FLU A&B, COVID) ARPGX2
Influenza A by PCR: NEGATIVE
Influenza B by PCR: NEGATIVE
SARS Coronavirus 2 by RT PCR: POSITIVE — AB

## 2021-12-13 NOTE — ED Notes (Signed)
Pt c/o body aches and upper respiratory symptoms today.  NAD ? ? ?

## 2022-01-20 ENCOUNTER — Encounter: Payer: Medicaid Other | Attending: Physician Assistant | Admitting: Registered"

## 2022-01-20 DIAGNOSIS — R634 Abnormal weight loss: Secondary | ICD-10-CM | POA: Diagnosis not present

## 2022-01-20 NOTE — Patient Instructions (Addendum)
Instructions/Continued Goals:  ? ?Make sure to get in three meals per day. Try to have balanced meals like the My Plate example (see handout). Include lean proteins, vegetables, fruits, and whole grains at meals.  ?Goal: 3 meals per day and 2-3 snacks  ?If unable to eat at a meal, have a [employer] drink ?Continue with 1 [employer] daily.  ?Continue with adding high calorie ingredients as much as possible (butter, oils, sauces, dips, etc)  ? ?While having GI Issues:  ?Continue staying hydrated.  ?If diarrhea doesn't improve within next couple days-let your doctor know.  ? ?Reflux: Continue:  ?Recommend avoiding acidic foods (tomato sauce, orange juice, lemonade), spicy foods and peppermint/spearmint ?Avoid laying down within 3 hours of eating  ?Prop up in bed when sleeping  ? ?Recommend multivitamin.  ?

## 2022-01-20 NOTE — Progress Notes (Signed)
Medical Nutrition Therapy:  Appt start time: 1045 end time:  1115. ? ?Assessment:  Primary concerns today: Pt referred due to abnormal weight loss.  ? ?Nutrition Follow Up: Pt present for appointment with son.  ? ?Pt reports prior to recent antibiotic was doing well-no reflux or nausea. Reports still getting in meals and eating like before but having bloating and diarrhea since on antibiotic. Reports today is last day with antibiotic. Reports having diarrhea at night 2-3 times. Reports staying hydrated. Reports having drinkable Activia yogurts more lately. Still having 1 daily Molli Posey.  ? ?Weight History: Reports her wt was around 100 lb years ago, highest she has weighed is 110 lb. Pt reports she has always been small. Pt reports she would like to gain weight, reports may be 115 lb or so or a healthy BMI range.   ? ?Food Allergies/Intolerances: Reports high sodium foods give her a headache.  ? ?GI Concerns: Diarrhea since starting antibiotic.  ? ?Pertinent Lab Values: N/A ? ?Weight Hx: Pt reports highest weight around 110 lb. Reports fluctuating from ~100 lb.  ? ?Preferred Learning Style:  ?No preference indicated  ? ?Learning Readiness:  ?Ready ? ?MEDICATIONS: See list. Reviewed. Supplements: None reported.  ?  ?DIETARY INTAKE: ? ?Usual eating pattern includes 3 meals and 2-3 snacks per day. Pt limits red meat, does [place] meat instead. Tries to include plant based foods more often.  ? ?Common foods: yogurt, oatmeal.  Avoided foods: hard tacos. Dairy: oatmilk, Activia drinkable yogurt, [employer].   ? ?Typical Snacks: yogurt, blueberries, ice cream/popsicle, Fudge Rounds.  ? ?Typical Beverages: 1 [employer], 2-3 bottles water, decaf coffee.  ? ?Location of Meals: separately usually.  ? ?Electronics Present at [employer]: Sometimes.  ? ?24 Hour Recall: ? ?Today: ?Breakfast: 1 egg, 6 french toast sticks with syrup and butter, orange juice + Molli Posey + cinnamon and brown sugar  ? ?Yesterday:  ?Breakfast: Corn  Flakes cereal OR chicken biscuit mini with butter, jelly, egg wrap or omelet and 1 cup [employer] + cinnamon and brown sugar ?Snack: Waynetta Sandy, brownie  ?Lunch: bologna sandwich with mayo, chips, [employer] ?Snack: ?Dinner: meatloaf, mashed potatoes, sweet peas  ?Snack: Waynetta Sandy  ?Beverages:  ? ?Estimated energy needs: (Calucated using IBW of 120 lb):  ?2230 calories ?251-362 g carbohydrates ?44 g protein ?50-87 g fat ? ?Progress Towards Goal(s):  Some progress. ?  ?Nutritional Diagnosis:  ?NI-5.3 Inadequate protein-energy intake As related to low appetite.  As evidenced by pt's reported dietary recall and habits; BMI of 17.70. ?   ?Intervention:  Nutrition counseling provided. Wt today about the same as at many previous visits-pt reports it was up to 106 lb before diarrhea started. Reiterated high calorie nutrition. Discussed staying hydrated with diarrhea and advised pt contact doctor if diarrhea doesn't resolve. Pt appeared agreeable to information/goals discussed.  ? ?Instructions/Continued Goals:  ? ?Make sure to get in three meals per day. Try to have balanced meals like the My Plate example (see handout). Include lean proteins, vegetables, fruits, and whole grains at meals.  ?Goal: 3 meals per day and 2-3 snacks  ?If unable to eat at a meal, have a [employer] drink ?Continue with 1 [employer] daily.  ?Continue with adding high calorie ingredients as much as possible (butter, oils, sauces, dips, etc)  ? ?While having GI Issues:  ?Continue staying hydrated.  ?If diarrhea doesn't improve within next couple days-let your doctor know.  ? ?Reflux: Continue:  ?Recommend avoiding acidic  foods (tomato sauce, orange juice, lemonade), spicy foods and peppermint/spearmint ?Avoid laying down within 3 hours of eating  ?Prop up in bed when sleeping  ? ?Recommend multivitamin.  ? ?Teaching Method Utilized:  ?Visual ?Auditory ? ?Barriers to learning/adherence to lifestyle change: None reported.  ? ?Demonstrated degree of  understanding via:  Teach Back  ? ?Monitoring/Evaluation:  Dietary intake, exercise, and body weight in 6 week(s).  ?

## 2022-01-30 ENCOUNTER — Encounter: Payer: Self-pay | Admitting: Registered"

## 2022-03-17 ENCOUNTER — Encounter: Payer: Medicaid Other | Attending: Endocrinology | Admitting: Registered"

## 2022-03-17 DIAGNOSIS — R634 Abnormal weight loss: Secondary | ICD-10-CM | POA: Diagnosis present

## 2022-03-17 NOTE — Progress Notes (Signed)
Medical Nutrition Therapy:  Appt start time: 1000 end time:  1030.  Assessment:  Primary concerns today: Pt referred due to abnormal weight loss.   Nutrition Follow Up: Pt present for appointment with son.   Pt reports feeling a lot better since finishing the antibiotic she was on at last visit. Reports today did brown sugar and Molli Posey for breakfast. Reports doing 3 meals daily and snacking between more. Reports having some pound cake, chocolates lately. Reports GI symptoms much improved. Still reflux if she eats spicy foods or tomato sauces but will manage with medication. Reports sometimes purchasing plant based meals when able (pt wants to include more plant proteins). Beverages include probiotic yogurt, 1 [employer], 2 or more bottles water daily. Pt reports she really likes ice cream, sometimes eats it but pays for it due to her lactose intolerance.   Weight History: Reports her wt was around 100 lb years ago, highest she has weighed is 110 lb. Pt reports she has always been small. Pt reports she would like to gain weight, reports may be 115 lb or so or a healthy BMI range.    Food Allergies/Intolerances: Reports high sodium foods give her a headache.   GI Concerns: Reports no GI issues lately. Reports still if eating tomato sauce or spicy foods but managed with medication.   Other Signs/Symptoms: headaches-reports about the same as usual.   Pertinent Lab Values: N/A  Weight Hx: Pt reports highest weight around 110 lb. Reports fluctuating from ~100 lb.   Preferred Learning Style:  No preference indicated   Learning Readiness:  Ready  MEDICATIONS: See list. Reviewed. Supplements: vitamin D and plans to get a multivitamin as well.    DIETARY INTAKE:  Usual eating pattern includes 3 meals and 2-3 snacks per day. Pt limits red meat, does [place] meat instead. Tries to include plant based foods more often.   Common foods: yogurt, oatmeal.  Avoided foods: hard tacos. Dairy: oatmilk,  Activia drinkable yogurt, [employer].    Typical Snacks: yogurt, blueberries, ice cream/popsicle, Fudge Rounds.   Typical Beverages: 1 [employer], 2-3 bottles water, decaf coffee.   Location of Meals: separately usually.   Electronics Present at [employer]: Sometimes.   24 Hour Recall: Breakfast 7-8 AM: 2 eggs, 2 pieces toast, vegan butter (covering the bread), 1 [employer] + brown sugar  Snack: None reported.  Lunch 11-12 noon: [place] sandwich with mayo?   Snack: pound cake, ice cream  Dinner: 2 soft taco: meat, tomato, sour cream with water OR root beer   Snack:   Beverages:   Estimated energy needs: (Calucated using IBW of 120 lb):  2230 calories 251-362 g carbohydrates 44 g protein 50-87 g fat  Physical Activity: Swimming at pool at apartment complex. Sometimes exercise video x 1 day per week. Walking for transportation.     Progress Towards Goal(s):  Some progress.   Nutritional Diagnosis:  NI-5.3 Inadequate protein-energy intake As related to low appetite.  As evidenced by pt's reported dietary recall and habits; BMI of 17.70.    Intervention:  Nutrition counseling provided. Wt today up a little over 1 lb, however, pt is wearing heavier clothing today due to cold day. Discussed continuing with consistent intake. Recommend trying dairy free ice creams to provide high calorie food and also due to pt reporting she really likes ice cream and misses having it due to lactose intolerance. Recommend starting multivitamin. Reiterated ongoing high calorie nutrition goals. Pt appeared agreeable to information/goals discussed.  Instructions/Continued Goals:   Make sure to get in three meals per day. Try to have balanced meals like the My Plate example (see handout). Include lean proteins, vegetables, fruits, and whole grains at meals.  Goal: 3 meals per day and 2-3 snacks  If unable to eat at a meal, have a [employer] drink Continue with 1 [employer] daily.  Continue with adding  high calorie ingredients as much as possible (butter, oils, sauces, dips, etc)   Foods to Try:  Dairy free ice creams:  Ben and Jerry's dairy free line  Talenti dairy free line   Recommend multivitamin.   Teaching Method Utilized:  Visual Auditory  Barriers to learning/adherence to lifestyle change: None reported.   Demonstrated degree of understanding via:  Teach Back   Monitoring/Evaluation:  Dietary intake, exercise, and body weight in 6 week(s).

## 2022-03-23 ENCOUNTER — Encounter: Payer: Self-pay | Admitting: Registered"

## 2022-04-14 ENCOUNTER — Ambulatory Visit: Payer: Medicaid Other | Admitting: Registered"

## 2022-06-09 ENCOUNTER — Encounter: Payer: Medicaid Other | Attending: Physician Assistant | Admitting: Registered"

## 2022-06-09 DIAGNOSIS — R634 Abnormal weight loss: Secondary | ICD-10-CM | POA: Insufficient documentation

## 2022-06-09 NOTE — Patient Instructions (Addendum)
Instructions/Continued Goals:   Make sure to get in three meals per day. Try to have balanced meals like the My Plate example (see handout). Include lean proteins, vegetables, fruits, and whole grains at meals.  Goal: 3 meals per day and 2-3 snacks  If unable to eat at a meal, have a [employer] drink Try for a snack between each meal.  Include 2 Molli Posey daily Continue with adding high calorie ingredients as much as possible (butter, oils, sauces, dips, etc)   Foods to Try:  Dairy free ice creams:  [employer]'s dairy free line -May try [employer]   Recommend multivitamin.

## 2022-06-09 NOTE — Progress Notes (Unsigned)
Medical Nutrition Therapy:  Appt start time: 1130 end time:  .  Assessment:  Primary concerns today: Pt referred due to abnormal weight loss.   Nutrition Follow Up: Pt present for appointment with son.   Pt reports she has been doing ok with eating. Reports doing the drink in the morning and eggs. Reports this morning just did the [facility]. Reports snacking here and there-reports eating [facility] oatmeal squares as snacks. Reports she has been cramping and having some bleeding. Reports went to PCP about it. Is going for lab work on Monday. Reports feeling some nausea. Took Pepto this morning.     Weight History: Reports her wt was around 100 lb years ago, highest she has weighed is 110 lb. Pt reports she has always been small. Pt reports she would like to gain weight, reports may be 115 lb or so or a healthy BMI range.    Food Allergies/Intolerances: Reports high sodium foods give her a headache.   GI Concerns: Reports no GI issues lately. Reports still if eating tomato sauce or spicy foods but managed with medication.   Other Signs/Symptoms: headaches-reports about the same as usual.   Pertinent Lab Values: N/A  Weight Hx: Pt reports highest weight around 110 lb. Reports fluctuating from ~100 lb.   Preferred Learning Style:  No preference indicated   Learning Readiness:  Ready  MEDICATIONS: See list. Reviewed. Supplements: vitamin D and plans to get a multivitamin as well.    DIETARY INTAKE:  Usual eating pattern includes 3 meals and 2-3 snacks per day. Pt limits red meat, does [place] meat instead. Tries to include plant based foods more often.   Common foods: yogurt, oatmeal.  Avoided foods: hard tacos. Dairy: oatmilk, Activia drinkable yogurt, [employer].    Typical Snacks: yogurt, blueberries, ice cream/popsicle, Fudge Rounds.   Typical Beverages: 1 [employer], 2-3 bottles water, decaf coffee.   Location of Meals: separately usually.   Electronics Present at  [employer]: Sometimes.   24 Hour Recall: Breakfast AM:  1 [employer] + brown sugar, 1 egg, 1 slice whole wheat toast  Snack: None reported.  Lunch noon: sandwich OR oodles of noodles Snack: None reported.  Dinner: [employer]: crispy chicken sandwich, 2 nuggets, fries,  Snack:   Beverages:   Estimated energy needs: (Calucated using IBW of 120 lb):  2230 calories 251-362 g carbohydrates 44 g protein 50-87 g fat  Physical Activity: Swimming at pool at apartment complex. Sometimes exercise video x 1 day per week. Walking for transportation.     Progress Towards Goal(s):  Some progress.   Nutritional Diagnosis:  NI-5.3 Inadequate protein-energy intake As related to low appetite.  As evidenced by pt's reported dietary recall and habits; BMI of 17.70.    Intervention:  Nutrition counseling provided. Wt today up a little over 1 lb, however, pt is wearing heavier clothing today due to cold day. Discussed continuing with consistent intake. Recommend trying dairy free ice creams to provide high calorie food and also due to pt reporting she really likes ice cream and misses having it due to lactose intolerance. Recommend starting multivitamin. Reiterated ongoing high calorie nutrition goals. Pt appeared agreeable to information/goals discussed.   Instructions/Continued Goals:   Make sure to get in three meals per day. Try to have balanced meals like the My Plate example (see handout). Include lean proteins, vegetables, fruits, and whole grains at meals.  Goal: 3 meals per day and 2-3 snacks  If unable to eat at  a meal, have a [employer] drink Continue with 1 [employer] daily.  Continue with adding high calorie ingredients as much as possible (butter, oils, sauces, dips, etc)   Foods to Try:  Dairy free ice creams:  Ben and Jerry's dairy free line  [employer] dairy free line   Recommend multivitamin.   Teaching Method Utilized:  Visual Auditory  Barriers to learning/adherence to lifestyle  change: None reported.   Demonstrated degree of understanding via:  Teach Back   Monitoring/Evaluation:  Dietary intake, exercise, and body weight in 6 week(s).

## 2022-06-15 ENCOUNTER — Ambulatory Visit: Payer: Medicaid Other | Admitting: Registered"

## 2022-08-04 ENCOUNTER — Encounter: Payer: Medicaid Other | Attending: Family Medicine | Admitting: Registered"

## 2022-08-04 ENCOUNTER — Encounter: Payer: Self-pay | Admitting: Registered"

## 2022-08-04 VITALS — Wt 106.8 lb

## 2022-08-04 DIAGNOSIS — R634 Abnormal weight loss: Secondary | ICD-10-CM | POA: Insufficient documentation

## 2022-08-04 NOTE — Patient Instructions (Addendum)
Instructions/Continued Goals:   Make sure to get in three meals per day. Try to have balanced meals like the My Plate example (see handout). Include lean proteins, vegetables, fruits, and whole grains at meals.  Goal: 3 meals per day and 2-3 snacks  If unable to eat at a meal, have a [employer] drink Try for a snack between each meal.  Include 2 Molli Posey daily-doing great, continue  Continue with adding high calorie ingredients as much as possible (butter, oils, sauces, dips, etc)   Recommend multivitamin.

## 2022-08-04 NOTE — Progress Notes (Unsigned)
Medical Nutrition Therapy:  Appt start time: 1130 end time:  1200.  Assessment:  Primary concerns today: Pt referred due to abnormal weight loss.   Nutrition Follow Up: Pt present for appointment with son.   Pt reports she feels she has put on some wt. Reports doing 2 [employer] daily. Has tried dairy free ice creams and liked them. Reports stomach has been doing well. Reports trying to continue with fiber sources and limiting dairy. Reports food sometimes running low but goes to sister's home when it's low to get groceries or her church.   Weight History: Reports her wt was around 100 lb years ago, highest she has weighed is 110 lb. Pt reports she has always been small. Pt reports she would like to gain weight, reports may be 115 lb or so or a healthy BMI range.    Food Allergies/Intolerances: Reports high sodium foods give her a headache.   GI Concerns: Reports no GI issues lately. Reports still if eating tomato sauce or spicy foods but managed with medication.   Other Signs/Symptoms: headaches-reports about the same as usual.   Pertinent Lab Values: N/A  Weight Hx: Pt reports highest weight around 110 lb. Reports fluctuating from ~100 lb.   Preferred Learning Style:  No preference indicated   Learning Readiness:  Ready  MEDICATIONS: See list. Reviewed. Supplements: vitamin D and plans to get a multivitamin as well.    DIETARY INTAKE:  Usual eating pattern includes 3 meals and 2-3 snacks per day. Pt limits red meat, does [place] meat instead. Tries to include plant based foods more often.   Common foods: yogurt, oatmeal.  Avoided foods: hard tacos. Dairy: oatmilk, Activia drinkable yogurt, [employer].    Typical Snacks: yogurt, blueberries, ice cream/popsicle, Fudge Rounds.   Typical Beverages: 1 [employer], 2-3 bottles water.  Location of Meals: separately usually.   Electronics Present at [employer]: Sometimes.   24 Hour Recall: Breakfast AM:  1 [employer] + brown sugar,  mushrooms, rice, ranch, [employer]: None reported.  Lunch noon: [place] sandwich with small amount cheese, water   Snack: None reported.  Dinner: Dione Plover: beef chalupa, water  Snack:  1 Molli Posey + brown sugar Beverages:   Estimated energy needs: (Calucated using IBW of 120 lb):  2230 calories 251-362 g carbohydrates 44 g protein 50-87 g fat  Physical Activity: Sometimes exercise video x 1 day per week. Walking for transportation.     Progress Towards Goal(s):  Some progress.   Nutritional Diagnosis:  NI-5.3 Inadequate protein-energy intake As related to low appetite.  As evidenced by pt's reported dietary recall and habits; BMI of 17.70.    Intervention:  Nutrition counseling provided. Wt today up about 1 lb since last visit. Praised pt for getting in 2 [employer] daily. Reviewed ongoing high calorie goals. Pt appeared agreeable to information/goals discussed.   Instructions/Continued Goals:   Make sure to get in three meals per day. Try to have balanced meals like the My Plate example (see handout). Include lean proteins, vegetables, fruits, and whole grains at meals.  Goal: 3 meals per day and 2-3 snacks  If unable to eat at a meal, have a [employer] drink Try for a snack between each meal.  Include 2 Molli Posey daily-doing great, continue  Continue with adding high calorie ingredients as much as possible (butter, oils, sauces, dips, etc)   Recommend multivitamin.   Teaching Method Utilized:  Visual Auditory  Barriers to learning/adherence to  lifestyle change: None reported.   Demonstrated degree of understanding via:  Teach Back   Monitoring/Evaluation:  Dietary intake, exercise, and body weight in 2 month(s).

## 2022-10-13 ENCOUNTER — Encounter: Payer: Medicaid Other | Attending: Family Medicine | Admitting: Registered"

## 2022-10-13 VITALS — Wt 106.0 lb

## 2022-10-13 DIAGNOSIS — R634 Abnormal weight loss: Secondary | ICD-10-CM | POA: Diagnosis not present

## 2022-10-13 NOTE — Patient Instructions (Signed)
Instructions/Continued Goals:   Make sure to get in three meals per day. Try to have balanced meals like the My Plate example (see handout). Include lean proteins, vegetables, fruits, and whole grains at meals.  Goal: 3 meals per day and 2-3 snacks  If unable to eat at a meal, have a [employer] drink Continue snack between each meal.  Include 2 [employer] daily.  Try out dairy free ice creams, crackers with peanut butter or other high calorie foods as snacks  Continue with adding high calorie ingredients as much as possible (butter, oils, sauces, dips, etc)   Continue multivitamin.

## 2022-10-13 NOTE — Progress Notes (Signed)
Medical Nutrition Therapy:  Appt start time: 1700 end time:  .  Assessment:  Primary concerns today: Pt referred due to abnormal weight loss.   Nutrition Follow Up: Pt present for appointment with son.   Reports she went to ER earlier this month due to stomach pain and was dx with constipation. Was prescribed Gavilax. Now having BM about daily and no more stomach pain. Reports wasn't very hungry during that time.   Reports still doing [employer] in morning mixed with brown sugar and ate grits, eggs for breakfast. Reports doing a partial fast in afternoon with her church.   Weight History: Reports her wt was around 100 lb years ago, highest she has weighed is 110 lb. Pt reports she has always been small. Pt reports she would like to gain weight, reports may be 115 lb or so or a healthy BMI range.    Food Allergies/Intolerances: Reports high sodium foods give her a headache.   GI Concerns: Reports no GI issues lately. Reports still if eating tomato sauce or spicy foods but managed with medication.   Other Signs/Symptoms: headaches-reports about the same as usual.   Pertinent Lab Values: N/A  Weight Hx: Pt reports highest weight around 110 lb. Reports fluctuating from ~100 lb.   Preferred Learning Style:  No preference indicated   Learning Readiness:  Ready  MEDICATIONS: See list. Reviewed. Supplements: vitamin D and plans to get a multivitamin as well.    DIETARY INTAKE:  Usual eating pattern includes 3 meals and 2-3 snacks per day. Pt limits red meat, does [place] meat instead. Tries to include plant based foods more often.   Common foods: yogurt, oatmeal.  Avoided foods: hard tacos. Dairy: oatmilk, Activia drinkable yogurt, [employer].    Typical Snacks: yogurt, blueberries, ice cream/popsicle, Fudge Rounds.   Typical Beverages: 1 [employer], 2-3 bottles water.  Location of Meals: separately usually.   Electronics Present at [employer]: Sometimes.   24 Hour  Recall: Breakfast 7-8 AM: 1 egg in biscuit, grits,1 [employer]: None reported.  Lunch noon 12-130 PM: fasting   Snack 2-230 PM: [employer], cup noodles Snack 3-330 PM: 1 [employer]: boiled egg with noodles, Koolade Snack: None reported.  Beverages: 2 Dillard [place], water    Estimated energy needs: (Calucated using IBW of 120 lb):  2230 calories 251-362 g carbohydrates 44 g protein 50-87 g fat  Physical Activity: Sometimes exercise video x 1 day per week. Walking for transportation.     Progress Towards Goal(s):  Some progress.   Nutritional Diagnosis:  NI-5.3 Inadequate protein-energy intake As related to low appetite.  As evidenced by pt's reported dietary recall and habits; BMI of 17.70.    Intervention:  Nutrition counseling provided. Wt today up about 1 lb since last visit. Praised pt for getting in [place] daily. Reviewed ongoing high calorie goals. Pt appeared agreeable to information/goals discussed.   Instructions/Continued Goals:   Make sure to get in three meals per day. Try to have balanced meals like the My Plate example (see handout). Include lean proteins, vegetables, fruits, and whole grains at meals.  Goal: 3 meals per day and 2-3 snacks  If unable to eat at a meal, have a [employer] drink Try for a snack between each meal.  Include 2 Dillard [place] daily-doing great, continue  Continue with adding high calorie ingredients as much as possible (butter, oils, sauces, dips, etc)   Recommend multivitamin.   Teaching  Method Utilized:  Visual Auditory  Barriers to learning/adherence to lifestyle change: None reported.   Demonstrated degree of understanding via:  Teach Back   Monitoring/Evaluation:  Dietary intake, exercise, and body weight in 2 month(s).

## 2022-10-19 ENCOUNTER — Encounter: Payer: Self-pay | Admitting: Registered"

## 2022-12-14 ENCOUNTER — Encounter: Payer: Self-pay | Admitting: Dietician

## 2022-12-14 ENCOUNTER — Encounter: Payer: Medicaid Other | Attending: Family Medicine | Admitting: Dietician

## 2022-12-14 VITALS — Wt 109.4 lb

## 2022-12-14 DIAGNOSIS — R634 Abnormal weight loss: Secondary | ICD-10-CM | POA: Diagnosis not present

## 2022-12-14 DIAGNOSIS — Z713 Dietary counseling and surveillance: Secondary | ICD-10-CM | POA: Insufficient documentation

## 2022-12-14 DIAGNOSIS — Z681 Body mass index (BMI) 19 or less, adult: Secondary | ICD-10-CM | POA: Diagnosis not present

## 2022-12-14 NOTE — Progress Notes (Signed)
Medical Nutrition Therapy:  Appt start time: 1120 end time:  1200  Assessment:  Primary concerns today: Pt referred due to abnormal weight loss.   Nutrition Follow Up: Pt present for appointment with son.   Pt states she continues to desire to gain weight but wants to ensure it is in a healthy manner. Pt states she has been interested in getting a membership at the [facility] to do water aerobics or work on getting more active because she feels that when she is home she just wants to sit around.   Pt states they recently got a blender. She has been going to a smoothie shop and getting a high-calorie smoothie she enjoys and wants to start making some at home.   Pt states she continues to receive [employer] and drinks 2 per day. Pt states she likes to add a little bit of brown sugar to her shake to make it taste better.  Pt states since previous visit they have been able to utilize some food resources such as food pantry and their church will occasionally give them a box. Pt states this is helpful for when their food stamps run out.  Weight History: Reports her wt was around 100 lb years ago, highest she has weighed is 110 lb. Pt reports she has always been small. Pt reports she would like to gain weight, reports may be 115 lb or so or a healthy BMI range.    Food Allergies/Intolerances: Reports high sodium foods give her a headache.   GI Concerns: Reports no GI issues lately. Reports will have acid reflux if she consumes tomato sauce or spicy foods but managed with medication.   Other Signs/Symptoms: None reported today.   Pertinent Lab Values: N/A  Weight Hx: Pt reports highest weight around 110 lb. Reports fluctuating from ~100 lb.   Preferred Learning Style:  No preference indicated   Learning Readiness:  Ready  MEDICATIONS: See list. Reviewed. Supplements: vitamin D; taking a multivitamin.    DIETARY INTAKE:  Usual eating pattern includes 3 meals and 2-3 snacks per day. Pt  limits red meat and pork, uses [place] meat instead. Tries to include plant based foods more often.   Common foods: yogurt, oatmeal.  Avoided foods: hard tacos. Dairy: oatmilk, Activia drinkable yogurt, [employer].    Typical Snacks: yogurt, blueberries, ice cream/popsicle, Fudge Rounds.   Typical Beverages: 2 [employer] [place], 2-3 bottles water.  Location of Meals: separately usually.   Electronics Present at [employer]: Sometimes.   24 Hour Recall: Breakfast 7-8 AM: 1 scrambled egg on vegan buttered toast with [employer] shake and brown sugar  Snack: None reported.  Lunch 12-130 PM: canned sardines and crackers OR [place] sandwich  Snack 2-230 PM:  Snack 3-330 PM: gutsy fruit pouch and [employer]  Dinner: tacos with ground [place]  Snack: None reported.  Beverages: water   Estimated energy needs: (Calucated using IBW of 120 lb):  2230 calories 251-362 g carbohydrates 44 g protein 50-87 g fat  Physical Activity: Sometimes exercise video x 1 day per week. Walking for transportation.     Progress Towards Goal(s):  Some progress.   Nutritional Diagnosis:  NI-5.3 Inadequate protein-energy intake As related to low appetite.  As evidenced by pt's reported dietary recall and habits; BMI of 17.70.    Intervention:  Nutrition counseling provided. Wt is up 3 lbs since previous assessment 2 months ago. Reviewed continuing goals and encouraged pt to continue to drink 2 [employer] daily.  Discussed how to build a balanced and high calorie/protein smoothie at home. Reviewed MyPlate and strategies to create balanced meals/snacks. Discussed importance of physical activity in a healthy lifestyle. Pt appeared agreeable to information/goals discussed.   Instructions/Continued Goals:   Make sure to get in three meals per day. Try to have balanced meals like the My Plate example (see handout). Include lean proteins, vegetables, fruits, and whole grains at meals.  Goal: 3 meals per day and 2-3 snacks  If  unable to eat at a meal, have a [employer] drink Continue snack between each meal.  Include 2 Dillard [place] daily.  Try out dairy free ice creams, crackers with peanut butter or other high calorie foods as snacks  Continue with adding high calorie ingredients as much as possible (butter, oils, sauces, dips, etc)   Smoothie idea: frozen fruit, boost/[employer] shake, peanut butter  Continue multivitamin.   Teaching Method Utilized:  Visual Auditory  Barriers to learning/adherence to lifestyle change: None reported.   Demonstrated degree of understanding via:  Teach Back   Monitoring/Evaluation:  Dietary intake, exercise, and body weight prn. Follow up in 5 months.

## 2022-12-14 NOTE — Patient Instructions (Signed)
Make sure to get in three meals per day. Try to have balanced meals like the My Plate example (see handout). Include lean proteins, vegetables, fruits, and whole grains at meals.  Goal: 3 meals per day and 2-3 snacks  If unable to eat at a meal, have a [employer] drink Continue snack between each meal.  Include 2 Dillard [place] daily.  Try out dairy free ice creams, crackers with peanut butter or other high calorie foods as snacks  Continue with adding high calorie ingredients as much as possible (butter, oils, sauces, dips, etc)   Continue multivitamin.   Smoothie idea: frozen fruit, boost/[employer] shake, peanut butter

## 2023-05-16 ENCOUNTER — Encounter: Payer: Medicaid Other | Attending: Endocrinology | Admitting: Dietician

## 2023-05-16 ENCOUNTER — Encounter: Payer: Self-pay | Admitting: Dietician

## 2023-05-16 VITALS — Wt 110.3 lb

## 2023-05-16 DIAGNOSIS — R634 Abnormal weight loss: Secondary | ICD-10-CM | POA: Diagnosis not present

## 2023-05-16 NOTE — Progress Notes (Signed)
Medical Nutrition Therapy:  Appt start time: 1100 end time:  1130  Assessment:  Primary concerns today: Pt referred due to abnormal weight loss.   Nutrition Follow Up: Pt present for appointment with son.   Mom states she has been avoiding red meat and also does not like tuna. She states she has been trying to eat more plant based. Mom states she tried plant based chicken but did not like it. She states she can't drink milk or eat cheese but she tolerates yogurt well and wants to try cottage cheese with pineapple.   Pt states she is still getting [employer] and drinks 2 per day. She states sometimes she does not feel like eating so she has the [employer] instead. She states every morning she mixes it with brown sugar.   Pt states she drinks V8 juice and eats gutsy fruit and veggie puree pouches. She states the vegetables she enjoys most are raw spinach for salad with cucumber and tomato, and mushrooms.   Pt states she ended up getting the membership to the [facility] and has been going to water aerobics and really enjoys the social aspect of it.   Weight History: Reports her wt was around 100 lb years ago, highest she has weighed is 110 lb. Pt reports she has always been small. Pt reports she would like to gain weight, reports may be 115 lb or so or a healthy BMI range.    Food Allergies/Intolerances: Reports high sodium foods give her a headache.   GI Concerns: Reports no GI issues lately. Reports will have acid reflux if she consumes tomato sauce or spicy foods but managed with medication.   Other Signs/Symptoms: None reported today.   Pertinent Lab Values: N/A  Weight Hx: Pt reports highest weight around 110 lb. Reports fluctuating from ~100 lb.  Today's Weight 05/16/23: 110.3 lb  Preferred Learning Style:  No preference indicated   Learning Readiness:  Ready  MEDICATIONS: See list. Reviewed. Supplements: vitamin D; taking a multivitamin.    DIETARY INTAKE:  Usual eating pattern  includes 3 meals and 1-2 snacks per day. Pt limits red meat and pork, uses [place] meat instead. Tries to include plant based foods more often.   Common foods: yogurt, oatmeal, egg.  Avoided foods: hard tacos, red meat Dairy: oatmilk, Activia drinkable yogurt, [employer].    Typical Snacks: yogurt, blueberries, ice cream/popsicle, Fudge Rounds.   Typical Beverages: 2 Molli Posey, 2-3 bottles water.  Location of Meals: not assessed  Electronics Present at Mealtimes: Sometimes.   24 Hour Recall: Breakfast 7-8 AM: 1 scrambled egg and pancakes with [employer] shake  Snack: None reported.  Lunch 12-130 PM: [place] sandwich  Snack: sometimes snacks on candy Snack 3-330 PM: gutsy fruit pouch and [employer]  Dinner: tacos with ground [place]  Snack: None reported.  Beverages: water   Estimated energy needs: (Calucated using IBW of 120 lb):  2230 calories 251-362 g carbohydrates 44 g protein 50-87 g fat  Physical Activity: water aerobics  Progress Towards Goal(s):  Some progress.   Nutritional Diagnosis:  NI-5.3 Inadequate protein-energy intake As related to low appetite.  As evidenced by pt's reported dietary recall and habits; BMI of 17.70.    Intervention:  Nutrition counseling provided. Wt is up 1 lbs since previous assessment 5 months ago. Reviewed continuing goals and encouraged pt to continue to drink 2 [employer] daily. Discussed importance of consistent meal and snack times and following a schedule of breakfast, snack,  lunch, snack, dinner. Encouraged pt to have something small for a snack including carbohydrate and protein source, if she does not feel like eating. Worked together to [employer] for balanced snacks pt would enjoy. Reviewed MyPlate and strategies to create balanced meals/snacks. Encouraged pt to continue going to the [facility], as physical activity is both important for physical and mental health. Pt appeared agreeable to information/goals discussed.    Instructions/Continued Goals:   Make sure to get in three meals per day. Try to have balanced meals like the My Plate example. Include lean proteins, vegetables, fruits, and whole grains at meals.  Goal: 3 meals per day and 2-3 snacks  If unable to eat at a meal, have a [employer] drink Continue snack between each meal.  Include 2 Molli Posey daily.  Try out dairy free ice creams, crackers with peanut butter or other high calorie foods as snacks  Continue with adding high calorie ingredients as much as possible (butter, oils, sauces, dips, etc)   Continue multivitamin.   Teaching Method Utilized:  Visual Auditory  Barriers to learning/adherence to lifestyle change: None reported.   Demonstrated degree of understanding via:  Teach Back   Monitoring/Evaluation:  Dietary intake, exercise, and body weight prn. Follow up in 3 months.

## 2023-08-21 ENCOUNTER — Encounter: Payer: Medicaid Other | Attending: Physician Assistant | Admitting: Dietician

## 2023-08-21 ENCOUNTER — Encounter: Payer: Self-pay | Admitting: Dietician

## 2023-08-21 VITALS — Wt 113.0 lb

## 2023-08-21 DIAGNOSIS — R634 Abnormal weight loss: Secondary | ICD-10-CM

## 2023-08-21 NOTE — Progress Notes (Signed)
Medical Nutrition Therapy:  Appt start time: 1140 end time:  1215  Assessment:  Primary concerns today: Pt referred due to abnormal weight loss.   Nutrition Follow Up: Pt present for appointment with son.   Mom states she been trying to stay on schedule with goals discussed in previous visits. Pt reports she has been sleeping good. Pt states she saw the doctor in order to get her Molli Posey supplement drinks and is currently waiting to receive them from the DME company. Pt reports they now have a chef in the house so he has been cooking most of their meals, which she feels has been easier for her. Pt states since he has been cooking she feels she has been eating larger portions at meals so she does not get as hungry between meals. 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 She states this used to happen with crunchy foods so she tries to avoid very crunchy foods (chips, nuts, etc). This RD provided 8 [employer] samples to send home while she is waiting for her order to arrive.  Weight History: Reports her wt was around 100 lb years ago, highest she has weighed is 113 lb (today's visit). Pt reports she has always been small. Pt reports she would like to gain weight, reports may be 115 lb or so or a healthy BMI range.    Food Allergies/Intolerances: Reports high sodium foods give her a headache.   GI Concerns: Reports no GI issues lately. Reports will have acid reflux if she consumes tomato sauce or spicy foods but managed with medication.   Other Signs/Symptoms: None reported today.   Pertinent Lab Values: N/A  Weight Hx: Pt reports highest weight around 110 lb. Reports fluctuating from ~100 lb.  Wt Readings from Last 3 Encounters:  08/21/23 113 lb (51.3 kg)  05/16/23 110 lb 4.8 oz (50 kg)  12/14/22 109 lb 6.4 oz (49.6 kg)   Preferred Learning Style:  No preference indicated   Learning Readiness:  Ready  MEDICATIONS: See list. Reviewed. Supplements: vitamin D; taking a multivitamin.     DIETARY INTAKE:  Usual eating pattern includes 3 meals and 1-2 snacks per day. Pt limits red meat and pork, uses [place] meat instead. Tries to include plant based foods more often.   Common foods: yogurt, oatmeal, egg.  Avoided foods: hard tacos, red meat Dairy: oatmilk, Activia drinkable yogurt, [employer].    Typical Snacks: yogurt, blueberries, ice cream/popsicle, Fudge Rounds.   Typical Beverages: 2 Molli Posey, 2-3 bottles water.  Location of Meals: not assessed  Electronics Present at Mealtimes: Sometimes.   24 Hour Recall: Breakfast 7-8 AM: 1 scrambled egg and [employer]ivia yogurt and grapes  Snack: caramel candy, chips, OR pineapple Lunch 12-130 PM: [place] sandwich Snack: sometimes snacks (candy or chocolate muffins) or [employer] shake Dinner: burger and chips OR hotdog OR chicken OR fufu and soup Snack: None reported.  Beverages: water, Molli Posey  Estimated energy needs: (Calucated using IBW of 120 lb):  2230 calories 251-362 g carbohydrates 44 g protein 50-87 g fat  Physical Activity: water aerobics  Progress Towards Goal(s):  Some progress.   Nutritional Diagnosis:  NI-5.3 Inadequate protein-energy intake As related to low appetite.  As evidenced by pt's reported dietary recall and habits; BMI of 17.70.    Intervention:  Nutrition education and counseling provided. Wt is up 3 lbs since previous assessment 3 months ago. Reviewed continuing goals and encouraged pt to continue to try a variety of  foods with her son. Discussed implementation of [employer] shake between meals as a snack, as opposed to sweets (candy, muffins, etc). Worked together to [employer] for balanced snacks pt would enjoy. Encouraged pt to continue going to the [facility], as physical activity is both important for physical and mental health. Pt appeared agreeable to information/goals discussed.   Instructions/Continued Goals:   New goal: Try 3 or more new vegetables by the next visit with the  Dietitian  Make sure to get in three meals per day. Try to have balanced meals like the My Plate example. Include lean proteins, vegetables, fruits, and whole grains at meals.  Goal: 3 meals per day and 2-3 snacks  If unable to eat at a meal, have a [employer] drink Have a snack between each meal (if desired) Pineapple Fruit + activia yogurt [employer] shake Include 2 [employer] daily.   Continue multivitamin.   Teaching Method Utilized:  Visual Auditory  Barriers to learning/adherence to lifestyle change: None reported.   Demonstrated degree of understanding via:  Teach Back   Monitoring/Evaluation:  Dietary intake, exercise, and body weight. Follow up in 3 months.

## 2023-11-23 ENCOUNTER — Ambulatory Visit: Payer: Medicaid Other | Admitting: Dietician

## 2023-12-06 ENCOUNTER — Other Ambulatory Visit: Payer: Self-pay

## 2023-12-06 ENCOUNTER — Encounter (HOSPITAL_BASED_OUTPATIENT_CLINIC_OR_DEPARTMENT_OTHER): Payer: Self-pay | Admitting: Emergency Medicine

## 2023-12-06 ENCOUNTER — Emergency Department (HOSPITAL_BASED_OUTPATIENT_CLINIC_OR_DEPARTMENT_OTHER)
Admission: EM | Admit: 2023-12-06 | Discharge: 2023-12-06 | Disposition: A | Discharge status: Home / Self Care | Attending: Emergency Medicine | Admitting: Emergency Medicine

## 2023-12-06 DIAGNOSIS — R531 Weakness: Secondary | ICD-10-CM | POA: Diagnosis present

## 2023-12-06 DIAGNOSIS — O99011 Anemia complicating pregnancy, first trimester: Secondary | ICD-10-CM

## 2023-12-06 DIAGNOSIS — R410 Disorientation, unspecified: Secondary | ICD-10-CM | POA: Diagnosis not present

## 2023-12-06 DIAGNOSIS — R42 Dizziness and giddiness: Secondary | ICD-10-CM

## 2023-12-06 DIAGNOSIS — R110 Nausea: Secondary | ICD-10-CM

## 2023-12-06 LAB — COMPREHENSIVE METABOLIC PANEL
ALT: 18 U/L (ref 0–44)
AST: 31 U/L (ref 15–41)
Albumin: 3.7 g/dL (ref 3.5–5.0)
Alkaline Phosphatase: 75 U/L (ref 38–126)
Anion gap: 7 (ref 5–15)
BUN: 7 mg/dL (ref 6–20)
CO2: 21 mmol/L — ABNORMAL LOW (ref 22–32)
Calcium: 9.1 mg/dL (ref 8.9–10.3)
Chloride: 101 mmol/L (ref 98–111)
Creatinine, Ser: 0.53 mg/dL (ref 0.44–1.00)
GFR, Estimated: >60 mL/min (ref >60)
Glucose, Bld: 97 mg/dL (ref 70–99)
Potassium: 4.1 mmol/L (ref 3.5–5.1)
Sodium: 129 mmol/L — ABNORMAL LOW (ref 135–145)
Total Bilirubin: 0.3 mg/dL (ref 0.0–1.2)
Total Protein: 7.5 g/dL (ref 6.5–8.1)

## 2023-12-06 LAB — URINALYSIS, MICROSCOPIC (REFLEX)

## 2023-12-06 LAB — CBC
HCT: 33.8 % — ABNORMAL LOW (ref 36.0–46.0)
Hemoglobin: 11.7 g/dL — ABNORMAL LOW (ref 12.0–15.0)
MCH: 30.9 pg (ref 26.0–34.0)
MCHC: 34.6 g/dL (ref 30.0–36.0)
MCV: 89.2 fL (ref 80.0–100.0)
Platelets: 263 10*3/uL (ref 150–400)
RBC: 3.79 MIL/uL — ABNORMAL LOW (ref 3.87–5.11)
RDW: 13.6 % (ref 11.5–15.5)
WBC: 10.1 10*3/uL (ref 4.0–10.5)
nRBC: 0 % (ref 0.0–0.2)

## 2023-12-06 LAB — URINALYSIS, ROUTINE W REFLEX MICROSCOPIC
Bilirubin Urine: NEGATIVE
Glucose, UA: NEGATIVE mg/dL
Ketones, ur: NEGATIVE mg/dL
Nitrite: NEGATIVE
Protein, ur: NEGATIVE mg/dL
Specific Gravity, Urine: <=1.005 (ref 1.005–1.030)
pH: 6 (ref 5.0–8.0)

## 2023-12-06 LAB — PREGNANCY, URINE: Preg Test, Ur: POSITIVE — AB

## 2023-12-06 LAB — HCG, QUANTITATIVE, PREGNANCY: hCG, Beta Chain, Quant, S: >250000 m[IU]/mL — ABNORMAL HIGH (ref <5)

## 2023-12-06 LAB — LIPASE, BLOOD: Lipase: 41 U/L (ref 11–51)

## 2023-12-06 MED ORDER — ONDANSETRON 4 MG PO TBDP
4.0000 mg | ORAL_TABLET | Freq: Once | ORAL | Status: AC
  Administered 2023-12-06: 4 mg via ORAL
  Filled 2023-12-06: qty 1

## 2023-12-06 NOTE — Discharge Instructions (Addendum)
 Thank you for letting us evaluate you today! You are mildly anemic at 11.6 (with normal being >12) Please take some iron supplements found over the counter and eat a high iron diet  I have sent your urine for culture as there is trace blood and leukocytes in there but not impressive enough to treat for UTI. Please have GYN f/u with this esp if having burning with urination or UTI symptoms  Please follow up with GYN for next appointment

## 2023-12-06 NOTE — ED Triage Notes (Addendum)
 Pt POV c/o nausea, grogginess, dizziness since this AM, appx 1030.  Denies diarrhea, fever. Good po intake until today.  Denies known sick contact.   Pt reports being [redacted] wks pregnant. Receiving prenatal care at [facility] [place], [place] [facility]. Reports she has yet to feel baby move this pregnancy. Denies d/c, bleeding.

## 2023-12-06 NOTE — ED Provider Notes (Signed)
 [place] [facility] Provider Note   CSN: [id number] Arrival date & time: 12/06/23  1344     History  Chief Complaint  Patient presents with   Nausea   Dizziness    Laura Liu is a 43 y.o. female G2P1 presents to emergency department for evaluation of "grogginess", nausea, intermittent dizziness that started at 1030 this morning.  She endorses that dizziness worsens with movement and ambulation.  She does have a history of vertigo but reports that this does not feel the same.  She states that she has been eating and drinking normally.  She denies headaches, vomiting, diarrhea, abdominal pain, vaginal bleeding, vaginal discharge or urinary symptoms.  Of note, she was most recently seen 12/04/2023 at her GYN for mild vaginal bleeding and nausea.  She had a pelvic ultrasound that confirmed a live IUP and subchorionic hemorrhage that was likely contributing to vaginal bleeding. She is to follow up with them in one week for repeat viability [place]. She has not had vaginal bleeding since then. Prescribed zofran for nausea but has not taken it.  Dizziness Associated symptoms: no chest pain, no diarrhea, no headaches, no nausea, no palpitations, no shortness of breath, no vomiting and no weakness       Home Medications Prior to Admission medications   Medication Sig Start Date End Date Taking? Authorizing Provider  mupirocin ointment (BACTROBAN) 2 % Apply topically 2 (two) times daily. Apply to right nare 10/01/21   Ernie Avena, MD  norethindrone-ethinyl estradiol (LOESTRIN) 1-20 MG-MCG tablet Take 1 tablet by mouth daily. 12/25/19   [provider]  VITAMIN D PO Take by mouth.    [provider]      Allergies    Patient has no known allergies.    Review of Systems   Review of Systems  Constitutional:  Negative for chills, fatigue and fever.  Respiratory:  Negative for cough, chest tightness, shortness of breath and wheezing.    Cardiovascular:  Negative for chest pain and palpitations.  Gastrointestinal:  Negative for abdominal pain, constipation, diarrhea, nausea and vomiting.  Neurological:  Positive for dizziness. Negative for seizures, weakness, light-headedness, numbness and headaches.    Physical Exam Updated Vital Signs BP 124/85   Pulse 77   Temp 97.7 F (36.5 C)   Resp 16   Ht 5\' 4"  (1.626 m)   Wt 50.8 kg   SpO2 100%   BMI 19.22 kg/m  Physical Exam Vitals and nursing note reviewed.  Constitutional:      General: She is not in acute distress.    Appearance: Normal appearance.  HENT:     Head: Normocephalic and atraumatic.  Eyes:     General: Lids are normal. Vision grossly intact.     Extraocular Movements:     Right eye: Normal extraocular motion and no nystagmus.     Left eye: Normal extraocular motion and no nystagmus.     Conjunctiva/sclera: Conjunctivae normal.  Cardiovascular:     Rate and Rhythm: Normal rate.  Pulmonary:     Effort: Pulmonary effort is normal. No respiratory distress.     Breath sounds: Normal breath sounds.  Abdominal:     General: Bowel sounds are normal. There is no distension.     Palpations: Abdomen is soft.     Tenderness: There is no abdominal tenderness. There is no guarding.     Comments: Small gravida abdomen. No TTP  Musculoskeletal:     Right lower leg: No  edema.     Left lower leg: No edema.  Skin:    Capillary Refill: Capillary refill takes less than 2 seconds.     Coloration: Skin is not jaundiced or pale.  Neurological:     General: No focal deficit present.     Mental Status: She is alert and oriented to person, place, and time. Mental status is at baseline.     Cranial Nerves: No cranial nerve deficit.     Sensory: No sensory deficit.     Motor: No weakness.     Coordination: Coordination normal.     Comments: Following commands appropriately. Ambulates wo difficulty, weakness, nor limp. No dizziness on EDP exam. Motor 5/5 of BUE and  BLE.  Sensation 2/2 BUE and BLE.     ED Results / Procedures / Treatments   Labs (all labs ordered are listed, but only abnormal results are displayed) Labs Reviewed  COMPREHENSIVE METABOLIC PANEL - Abnormal; Notable for the following components:      Result Value   Sodium 129 (*)    CO2 21 (*)    All other components within normal limits  CBC - Abnormal; Notable for the following components:   RBC 3.79 (*)    Hemoglobin 11.7 (*)    HCT 33.8 (*)    All other components within normal limits  URINALYSIS, ROUTINE W REFLEX MICROSCOPIC - Abnormal; Notable for the following components:   APPearance HAZY (*)    Hgb urine dipstick TRACE (*)    Leukocytes,Ua TRACE (*)    All other components within normal limits  PREGNANCY, URINE - Abnormal; Notable for the following components:   Preg Test, Ur POSITIVE (*)    All other components within normal limits  HCG, QUANTITATIVE, PREGNANCY - Abnormal; Notable for the following components:   hCG, Beta Chain, Quant, S >250,000 (*)    All other components within normal limits  URINALYSIS, MICROSCOPIC (REFLEX) - Abnormal; Notable for the following components:   Bacteria, UA RARE (*)    All other components within normal limits  URINE CULTURE  LIPASE, BLOOD    EKG None  Radiology No results found.  Procedures Procedures    Medications Ordered in ED Medications  ondansetron (ZOFRAN-ODT) disintegrating tablet 4 mg (4 mg Oral Given 12/06/23 1713)    ED Course/ Medical Decision Making/ A&P                                 Medical Decision Making Amount and/or Complexity of Data Reviewed Labs: ordered.  Risk Prescription drug management.   Patient presents to the ED for concern of "grogginess", dizziness, nausea, this involves an extensive number of treatment options, and is a complaint that carries with it a high risk of complications and morbidity.  The differential diagnosis includes anemia, electrolyte abnormality, uti   Co  morbidities that complicate the patient evaluation  See HPI   Additional history obtained:  Additional history obtained from Nursing and Outside Medical Records   External records from outside source obtained and reviewed including  Triage RN note OB/GYN note [facility] 12/03/2023 [facility]   Lab Tests:  I Ordered, and personally interpreted labs.  The pertinent results include:   Sodium 129 Hemoglobin 11.7 (normally WNL) UA shows trace hemoglobin and rare bacteria but otherwise unremarkable hCG greater than 250,000     Medicines ordered and prescription drug management:  I ordered medication including zofran  for  nausea  Reevaluation of the patient after these medicines showed that the patient improved I have reviewed the patients home medicines and have made adjustments as needed     Problem List / ED Course:  Dizziness Intermittent and seems positional in nature. Ambulated without difficulty. No focal deficits nor HA Resolved while in ED Could be related to previous hx of vertigo vs new mild anemia Neurologically intact with no focal deficits Nausea Provided zofran 4mg  as she has not picked up her nausea meds prescribed from OB which improved nausea. No vomiting Passed PO challenge UA appears noninfectious at this time. No urinary symptoms. Will send for culture to ensure no UTI Mild anemia in pregnancy Hgb 11.7.  No vaginal bleeding Discussed high iron diet. Patient intermittently takes iron supplements   Reevaluation:  After the interventions noted above, I reevaluated the patient and found that they have :improved   Social Determinants of Health:  Has OBGYN f/u   Dispostion:  After consideration of the diagnostic results and the patients response to treatment, I feel that the patent would benefit from outpatient management with OB f/u at next scheduled appointment in next few days.   Discussed ED workup, disposition, return to ED  precautions with patient who expresses understanding agrees with plan.  All questions answered to their satisfaction.  They are agreeable to plan.  Discharge instructions provided on paperwork  Dicussed patient with Dr. Deretha Emory who reviewed ED workup and agrees to plan Final Clinical Impression(s) / ED Diagnoses Final diagnoses:  Anemia during pregnancy in first trimester  Nausea  Dizziness    Rx / DC Orders ED Discharge Orders     None         Judithann Sheen, PA 12/06/23 1742    Vanetta Mulders, MD 12/08/23 [phone]

## 2023-12-07 ENCOUNTER — Encounter: Payer: Self-pay | Admitting: Dietician

## 2023-12-07 ENCOUNTER — Encounter: Payer: Medicaid Other | Attending: Endocrinology | Admitting: Dietician

## 2023-12-07 VITALS — Wt 109.4 lb

## 2023-12-07 DIAGNOSIS — R634 Abnormal weight loss: Secondary | ICD-10-CM | POA: Diagnosis present

## 2023-12-07 DIAGNOSIS — Z681 Body mass index (BMI) 19 or less, adult: Secondary | ICD-10-CM | POA: Insufficient documentation

## 2023-12-07 DIAGNOSIS — Z713 Dietary counseling and surveillance: Secondary | ICD-10-CM | POA: Insufficient documentation

## 2023-12-07 LAB — URINE CULTURE: Culture: NO GROWTH

## 2023-12-07 NOTE — Progress Notes (Signed)
 Medical Nutrition Therapy:  Appt start time: 1015 end time: 1040  Assessment:  Primary concerns today: Pt referred due to abnormal weight loss.   Nutrition Follow Up: Pt present for appointment with son.   Mom reports she is [redacted] weeks pregnant. Pt states the 1st trimester of her pregnancy has been rough. Pt reports she was in the hospital yesterday because she felt weak and nauseated, and found out she was anemia. Pt reports she had not been taking her iron but had been taking her other supplements. Pt states she is now taking 65 mg iron as of yesterday, along with prenatal, vitamin b6, vitamin D. Pt reports other than that she has been slightly nauseated during the 1st trimester in general but has been trying to have smaller more frequent meals and snacks. Pt states she threw up once but has not since. Pt reports she continues to get some exercise in when she can [facility], walking outdoors). Pt reports she continues with Molli Posey supplementation and states if she is not hungry for a meal or snack she will have the [employer]. Mom states she continues to meet with nutritionist at [facility].    Weight History: Reports her wt was around 100 lb years ago, highest she has weighed is 113 lb. Pt reports she has always been small. Pt reports she would like to gain weight, reports may be 115 lb or so or a healthy BMI range.    Food Allergies/Intolerances: Reports high sodium foods give her a headache.   GI Concerns: Nausea and acid reflux with 1st trimester.   Other Signs/Symptoms: None reported today.   Pertinent Lab Values: N/A  Weight Hx: Pt reports highest weight around 110 lb. Reports fluctuating from ~100 lb.   Wt Readings from Last 3 Encounters:  12/07/23 109 lb 6.4 oz (49.6 kg)  12/06/23 112 lb (50.8 kg)  08/21/23 113 lb (51.3 kg)   Preferred Learning Style:  No preference indicated   Learning Readiness:  Ready  MEDICATIONS: Reviewed.  SUPPLEMENTS: iron, folate, prenatal, vitamin b6, vitamin  D   DIETARY INTAKE:  24 Hour Recall: Breakfast 7-8 AM: corn flakes and [employer] Snack: crackers and peanut butter OR oranges Lunch 12-130 PM: toast and eggs OR cereal  Snack: sometimes snacks (candy or chocolate muffins) or [employer] shake Dinner: pizza with ranch and potato [employer]: None reported.  Beverages: 3-5 water bottles, 2 Molli Posey  Physical Activity: water aerobics, walking on track, [employer]  Progress Towards Goal(s):  Some progress.   Nutritional Diagnosis:  NI-5.3 Inadequate protein-energy intake As related to low appetite.  As evidenced by pt's reported dietary recall and habits; BMI of 17.70.    Intervention:  Nutrition education and counseling provided. Reviewed continuing goals and encouraged pt to continue to try a variety of foods with her son. Discussed implementation of [employer] shake between meals as a snack, as opposed to sweets (candy, muffins, etc). Worked together to [employer] for balanced snacks pt would enjoy. Encouraged pt to continue going to the [facility], as physical activity is both important for physical and mental health. Discussed importance of smaller, more frequent meals and snacks during pregnancy in order to get all necessary nutrients. Discussed strategies for when pt is having reflux or nausea. Encouraged pt to continue grouping foods at meals/snacks to get a variety of nutrients in her diet. Pt appeared agreeable to information/goals discussed.   Instructions/Continued Goals:   Try 3 or more new vegetables by the next  visit with the Dietitian. - goal met! Good work!  Make sure to get in three meals per day. Try to have balanced meals like the My Plate example. Include lean proteins, vegetables, fruits, and whole grains at meals.  Goal: 3 meals per day and 2-3 snacks  If unable to eat at a meal, have a [employer] drink Have a snack between each meal (if desired) Pineapple Fruit + activia yogurt [employer] shake Include 2 [employer]  daily.   Continue with vitamin supplementation.   Teaching Method Utilized:  Visual Auditory  Barriers to learning/adherence to lifestyle change: None reported.   Demonstrated degree of understanding via:  Teach Back   Monitoring/Evaluation:  Dietary intake, exercise, and body weight. Follow up as needed.

## 2024-05-23 ENCOUNTER — Other Ambulatory Visit: Payer: Self-pay

## 2024-05-23 ENCOUNTER — Emergency Department (HOSPITAL_BASED_OUTPATIENT_CLINIC_OR_DEPARTMENT_OTHER)
Admission: EM | Admit: 2024-05-23 | Discharge: 2024-05-23 | Disposition: A | Discharge status: Home / Self Care | Payer: MEDICAID | Attending: Emergency Medicine | Admitting: Emergency Medicine

## 2024-05-23 ENCOUNTER — Encounter (HOSPITAL_BASED_OUTPATIENT_CLINIC_OR_DEPARTMENT_OTHER): Payer: Self-pay | Admitting: Emergency Medicine

## 2024-05-23 DIAGNOSIS — O26893 Other specified pregnancy related conditions, third trimester: Secondary | ICD-10-CM | POA: Insufficient documentation

## 2024-05-23 DIAGNOSIS — M2669 Other specified disorders of temporomandibular joint: Secondary | ICD-10-CM

## 2024-05-23 DIAGNOSIS — Z3A36 36 weeks gestation of pregnancy: Secondary | ICD-10-CM | POA: Insufficient documentation

## 2024-05-23 DIAGNOSIS — M26602 Left temporomandibular joint disorder, unspecified: Secondary | ICD-10-CM | POA: Diagnosis not present

## 2024-05-23 MED ORDER — CYCLOBENZAPRINE HCL 5 MG PO TABS
5.0000 mg | ORAL_TABLET | Freq: Once | ORAL | Status: AC
  Administered 2024-05-23: 5 mg via ORAL
  Filled 2024-05-23: qty 1

## 2024-05-23 MED ORDER — MAGNESIUM SULFATE 2 GM/50ML IV SOLN
2.0000 g | Freq: Once | INTRAVENOUS | Status: AC
  Administered 2024-05-23: 2 g via INTRAVENOUS
  Filled 2024-05-23: qty 50

## 2024-05-23 MED ORDER — CYCLOBENZAPRINE HCL 10 MG PO TABS: 10.0000 mg | ORAL_TABLET | Freq: Two times a day (BID) | ORAL | 0 refills | Status: AC | PRN

## 2024-05-23 NOTE — Progress Notes (Signed)
 Spoke with Dr Jayne regarding FHR tracing. Updated on baseline, +accels, 1 variable decel associated with a contraction, and UI noted. Provider acceptable of this tracing with regard to Pt's gestational age. Ok to discontinue FHR monitoring.  Called and updated Pt's primary RN at [facility]. Ok to remove FHR monitors from pt. Also provided some d/c instructions to pass along to the pt if she were to have pregnancy related concerns.

## 2024-05-23 NOTE — ED Provider Notes (Addendum)
 [place] [facility] Provider Note   CSN: [id number] Arrival date & time: 05/23/24  1856     Patient presents with: Jaw Pain   Laura Liu is a 43 y.o. pregnant female.  (36 weeks) who presents to the ED for lockjaw.  Patient has received normal prenatal care at [facility].  She is approximately 1 cm dilated based on her OB appointment yesterday.  No pelvic pain or vaginal bleeding.  Normal fetal movement reported.  This evening she began to feel pain and spasming in her left jaw.  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  Resolved after muscle relaxers at that time.  No fevers chills or other complaints.   HPI     Prior to Admission medications   Medication Sig Start Date End Date Taking? Authorizing Provider  cyclobenzaprine  (FLEXERIL ) 10 MG tablet Take 1 tablet (10 mg total) by mouth 2 (two) times daily as needed for up to 4 days for muscle spasms. 05/23/24 05/27/24 Yes Pamella Ozell LABOR, DO  mupirocin  ointment (BACTROBAN ) 2 % Apply topically 2 (two) times daily. Apply to right nare 10/01/21   Jerrol Agent, MD  norethindrone-ethinyl estradiol (LOESTRIN) 1-20 MG-MCG tablet Take 1 tablet by mouth daily. 12/25/19   [provider]  VITAMIN D PO Take by mouth.    [provider]    Allergies: Patient has no known allergies.    Review of Systems  Updated Vital Signs BP 134/87   Pulse 87   Temp 98.8 F (37.1 C) (Oral)   Resp (!) 21   Ht 5' 4 (1.626 m)   Wt 59 kg   SpO2 96%   BMI 22.31 kg/m   Physical Exam Vitals and nursing note reviewed.  HENT:     Head: Normocephalic and atraumatic.     Mouth/Throat:     Comments: Hypertonicity of left masseter Patient unable to fully open mouth Eyes:     Pupils: Pupils are equal, round, and reactive to light.  Cardiovascular:     Rate and Rhythm: Normal rate and regular rhythm.  Pulmonary:     Effort: Pulmonary effort is normal.     Breath sounds:  Normal breath sounds.  Abdominal:     Palpations: Abdomen is soft.     Tenderness: There is no abdominal tenderness.  Skin:    General: Skin is warm and dry.  Neurological:     Mental Status: She is alert.  Psychiatric:        Mood and Affect: Mood normal.     (all labs ordered are listed, but only abnormal results are displayed) Labs Reviewed - No data to display  EKG: None  Radiology: No results found.   Procedures   Medications Ordered in the ED  magnesium  sulfate IVPB 2 g 50 mL (0 g Intravenous Stopped 05/23/24 2317)  cyclobenzaprine  (FLEXERIL ) tablet 5 mg (5 mg Oral Given 05/23/24 2107)    Clinical Course as of 05/23/24 2324  Fri May 23, 2024  2310 Jaw spasms have improved She is able to open mouth Remained stable on toco Now normotensive She will follow-up with OB Will discharge with Flexeril  prescription as needed [MP]    Clinical Course User Index [MP] Pamella Ozell LABOR, DO                                 Medical Decision Making 43 year old  female presenting in 36-week of pregnancy with locked jaw.  She has had this with prior pregnancies.  She is uncomfortable appearing and hypertensive.  Elevated blood pressures most likely in the setting of acute pain associated with a locked jaw.  Low suspicion for preeclampsia but would consider preeclampsia workup if her hypertension persist after pain and spasms are controlled.  Normal toco monitoring normal fetal movement.  No abdominal pain or vaginal bleeding.  Will trial Flexeril  and IV magnesium  for lockjaw spasming and continue to monitor closely  Risk Prescription drug management.        Final diagnoses:  TMJ locking    ED Discharge Orders          Ordered    cyclobenzaprine  (FLEXERIL ) 10 MG tablet  2 times daily PRN        05/23/24 2314               Pamella Ozell LABOR, DO 05/23/24 2323    Pamella Ozell LABOR, DO 05/23/24 2324

## 2024-05-23 NOTE — Progress Notes (Signed)
 Pt is connected to FHR monitor, however unable to remotely monitor pt here at [facility]. Pts primary RN reports that the FHR has a baseline in the 140's but bounces up to the 160's. Guided RN through on how to set up tracing to print on paper, so attempt to get a hard copy of the FHR tracing.  Dr Jayne had stated, that since we're unable to remotely monitor, and pt is receiving care at [facility], it is ok to transfer care to [facility].

## 2024-05-23 NOTE — ED Notes (Signed)
 Spoke with OB monitor RN, ok to take pt off TOCO. Pt dispo home.

## 2024-05-23 NOTE — Progress Notes (Addendum)
 Spoke with Dr Berlin regarding pt being present at [facility]. Acknowledged that we will attempt to monitor FHR remotely, however there were issues with remote monitoring at this location earlier today, that he is aware of.

## 2024-05-23 NOTE — ED Notes (Signed)
 Pt A/Ox4, RR equal and unlabored.placed on monitor, in gown. TOCO applied. C/o L sided jaw pain, towel in mouth to relieve jaw pressure.

## 2024-05-23 NOTE — Progress Notes (Signed)
 FHR monitor began tracing remotely at 2223. It appeared that maternal HR was being traced. Called [facility] and spoke with RN, asked her to adjust. FHR tracing in 140s. Will remotely monitor & update Dr Jayne as needed.

## 2024-05-23 NOTE — Progress Notes (Addendum)
 Received call from [facility] charge RN regarding pt present with c/o of jaw pain. Pt was seen at [facility] [place], had a ROB appt today with no c/o. She is a G3P1 at [redacted]w[redacted]d, pregnancy is complicated by IUGR per prenatal records (pt had reported no complications). Pt did report some braxton hicks contx. RNs will get pt connected to FHR monitor and call back with updates.

## 2024-05-23 NOTE — ED Triage Notes (Addendum)
 Pt reports jaw is seizing on the RT side, hx of similar during previous pregnancies; denies ShoB or other s/s  Pt reports she is [redacted] weeks pregnant and all prenatal care done at [facility], has been having regular Darol Irving and is 1 cm dilated

## 2024-05-23 NOTE — Discharge Instructions (Signed)
 You were seen in the Emergency Department for lockjaw during pregnancy This resolved after we gave you a muscle relaxer and IV magnesium  We have called in a prescription for Flexeril  for you to take only as needed if the lockjaw returns Follow-up with your OB team Return to the emergency department for unresolved painful locked jaw
